# Patient Record
Sex: Female | Born: 1955
Health system: Southern US, Community
[De-identification: ages and names within clinical notes are randomized; demographics above are authoritative.]

## PROBLEM LIST (undated history)

## (undated) DIAGNOSIS — R0602 Shortness of breath: Secondary | ICD-10-CM

## (undated) DIAGNOSIS — M810 Age-related osteoporosis without current pathological fracture: Secondary | ICD-10-CM

## (undated) DIAGNOSIS — R600 Localized edema: Secondary | ICD-10-CM

## (undated) DIAGNOSIS — Z8489 Family history of other specified conditions: Secondary | ICD-10-CM

## (undated) DIAGNOSIS — K219 Gastro-esophageal reflux disease without esophagitis: Secondary | ICD-10-CM

## (undated) DIAGNOSIS — Z9889 Other specified postprocedural states: Secondary | ICD-10-CM

## (undated) DIAGNOSIS — J40 Bronchitis, not specified as acute or chronic: Secondary | ICD-10-CM

## (undated) DIAGNOSIS — T7840XA Allergy, unspecified, initial encounter: Secondary | ICD-10-CM

## (undated) DIAGNOSIS — M1990 Unspecified osteoarthritis, unspecified site: Secondary | ICD-10-CM

## (undated) DIAGNOSIS — E669 Obesity, unspecified: Secondary | ICD-10-CM

## (undated) DIAGNOSIS — J189 Pneumonia, unspecified organism: Secondary | ICD-10-CM

## (undated) DIAGNOSIS — J4520 Mild intermittent asthma, uncomplicated: Secondary | ICD-10-CM

## (undated) DIAGNOSIS — R112 Nausea with vomiting, unspecified: Secondary | ICD-10-CM

## (undated) DIAGNOSIS — E785 Hyperlipidemia, unspecified: Secondary | ICD-10-CM

## (undated) DIAGNOSIS — F32A Depression, unspecified: Secondary | ICD-10-CM

## (undated) DIAGNOSIS — F419 Anxiety disorder, unspecified: Secondary | ICD-10-CM

## (undated) DIAGNOSIS — E559 Vitamin D deficiency, unspecified: Secondary | ICD-10-CM

## (undated) HISTORY — DX: Gastro-esophageal reflux disease without esophagitis: K21.9

## (undated) HISTORY — DX: Depression, unspecified: F32.A

## (undated) HISTORY — PX: TONSILLECTOMY: SUR1361

## (undated) HISTORY — DX: Age-related osteoporosis without current pathological fracture: M81.0

## (undated) HISTORY — DX: Anxiety disorder, unspecified: F41.9

## (undated) HISTORY — DX: Obesity, unspecified: E66.9

## (undated) HISTORY — DX: Hyperlipidemia, unspecified: E78.5

## (undated) HISTORY — DX: Vitamin D deficiency, unspecified: E55.9

## (undated) HISTORY — PX: CERVICAL DISC SURGERY: SHX588

## (undated) HISTORY — PX: OTHER SURGICAL HISTORY: SHX169

## (undated) HISTORY — PX: TOTAL SHOULDER REPLACEMENT: SUR1217

## (undated) HISTORY — PX: ESOPHAGOGASTRODUODENOSCOPY: SHX1529

## (undated) HISTORY — DX: Unspecified osteoarthritis, unspecified site: M19.90

## (undated) HISTORY — PX: COLONOSCOPY: SHX174

## (undated) HISTORY — DX: Allergy, unspecified, initial encounter: T78.40XA

---
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
## 2002-04-15 ENCOUNTER — Encounter: Admission: RE | Admit: 2002-04-15 | Discharge: 2002-04-15 | Payer: Self-pay | Admitting: Family Medicine

## 2002-04-15 ENCOUNTER — Encounter: Payer: Self-pay | Admitting: Family Medicine

## 2002-05-21 ENCOUNTER — Encounter: Payer: Self-pay | Admitting: Internal Medicine

## 2002-10-29 ENCOUNTER — Other Ambulatory Visit: Admission: RE | Admit: 2002-10-29 | Discharge: 2002-10-29 | Payer: Self-pay | Admitting: Obstetrics & Gynecology

## 2003-11-07 ENCOUNTER — Other Ambulatory Visit: Admission: RE | Admit: 2003-11-07 | Discharge: 2003-11-07 | Payer: Self-pay | Admitting: Obstetrics and Gynecology

## 2007-04-06 ENCOUNTER — Emergency Department (HOSPITAL_COMMUNITY): Admission: EM | Admit: 2007-04-06 | Discharge: 2007-04-07 | Payer: Self-pay | Admitting: Emergency Medicine

## 2009-11-07 ENCOUNTER — Encounter: Admission: RE | Admit: 2009-11-07 | Discharge: 2009-11-07 | Payer: Self-pay | Admitting: Internal Medicine

## 2012-04-13 ENCOUNTER — Encounter: Payer: Self-pay | Admitting: Internal Medicine

## 2012-10-04 ENCOUNTER — Ambulatory Visit: Payer: 59

## 2012-10-04 ENCOUNTER — Ambulatory Visit (INDEPENDENT_AMBULATORY_CARE_PROVIDER_SITE_OTHER): Payer: 59 | Admitting: Family Medicine

## 2012-10-04 VITALS — BP 142/88 | HR 78 | Temp 98.2°F | Resp 16 | Ht 66.0 in | Wt 207.0 lb

## 2012-10-04 DIAGNOSIS — M25522 Pain in left elbow: Secondary | ICD-10-CM

## 2012-10-04 DIAGNOSIS — M7700 Medial epicondylitis, unspecified elbow: Secondary | ICD-10-CM

## 2012-10-04 DIAGNOSIS — M25529 Pain in unspecified elbow: Secondary | ICD-10-CM

## 2012-10-04 DIAGNOSIS — M7702 Medial epicondylitis, left elbow: Secondary | ICD-10-CM

## 2012-10-04 NOTE — Patient Instructions (Addendum)
1. Elbow pain, left  DG Elbow Complete Left  2. Medial epicondylitis of left elbow     RECOMMEND REST, ICING ELBOW TWICE DAILY (15-20 MINUTES EACH SESSION), HOME EXERCISES, ADVIL (3 TABLETS) THREE TIMES DAILY WITH FOOD.     Little Leaguer's Elbow (Medial Epicondylar Apophysitis) with Rehab Medial epicondylar apophysitis is also called Little Leaguer's elbow. The condition involves inflammation of the growth plate of the inner elbow (medial epicondyle) that causes elbow pain. The medial epicondyle is the site of attachment for the forearm muscles that are responsible for bending the wrist. Growth plates are a weaker area of bone, and are highly vulnerable to injury in skeletally immature individuals. Little Leaguer's elbow is an overuse injury that often lasts temporarily. It is uncommon after age 42. SYMPTOMS   Slightly swollen, warm, and tender bump of the inner elbow.  Elbow pain with use of the elbow and wrist.  Pain that gets worse when bending the wrist against force (curls, lifting, throwing), or following an extended period of vigorous exercise in an adolescent.  In more severe cases, pain during less vigorous activity.  Inability to throw at full speed.  Inability to fully straighten the elbow. CAUSES  Little Leaguer's elbow is caused by repeated stress to the medial epicondylar growth plate. This stress causes inflammation, which results in pain. RISK INCREASES WITH:  Throwing sports (baseball, softball).  Conditioning routines that are too intense, such as weight lifting.  Being overweight.  Boys between 73 and 17 years of age.  Rapid skeletal growth.  Poor strength and flexibility. PREVENTION  Warm up and stretch properly before activity.  Allow for adequate recovery between workouts.  Maintain physical fitness:  Strength, flexibility, and endurance.  Cardiovascular fitness.  Avoid extremes (too light or too heavy) in training.  Learn and use proper  exercise technique. PROGNOSIS  The course of the condition depends on the severity of the injury. Mild cases usually resolve with slight reduction of activity level. However, moderate to severe cases often require significantly reduced activity for 3 to 4 months.  RELATED COMPLICATIONS   Bone infection.  Growth plate pulling off the arm bone, resulting in a fracture.  Ongoing inability to fully straighten the elbow. TREATMENT  Treatment first involves the use of ice and medicine, to reduce pain and inflammation. The use of strengthening and stretching exercises may help reduce pain with activity. These exercises may be performed at home or with a therapist. Certain activities (throwing or lifting greater than 10 pounds) should be avoided, until symptoms go away. Individuals who suffer from chronic Little Leaguer's elbow are often referred to a therapist for further evaluation and treatment. Your caregiver may advise wearing a sling or brace to restrain the elbow. Surgery is rarely needed to treat this condition.  MEDICATION   If pain medicine is needed, nonsteroidal anti-inflammatory medicines (aspirin and ibuprofen), or other minor pain relievers (acetaminophen), are often advised.  Do not take pain medicine for 7 days before surgery.  Prescription pain relievers may be given, if your caregiver thinks they are needed. Use only as directed and only as much as you need.  Corticosteroid injections may be given by your caregiver. These injections should be reserved for the most serious cases, because they may only be given a certain number of times. HEAT AND COLD  Cold treatment (icing) should be applied for 10 to 15 minutes every 2 to 3 hours for inflammation and pain, and immediately after activity that aggravates your symptoms. Use  ice packs or an ice massage.  Heat treatment may be used before performing stretching and strengthening activities prescribed by your caregiver, physical  therapist, or athletic trainer. Use a heat pack or a warm water soak. SEEK MEDICAL CARE IF:   Symptoms get worse or do not improve in 4 weeks, despite treatment.  You develop a fever greater than 101F. EXERCISES  RANGE OF MOTION (ROM) AND STRETCHING EXERCISES - Little Leaguer's Elbow These exercises may help you when beginning to rehabilitate your injury. Your symptoms may resolve with or without further involvement from your physician, physical therapist or athletic trainer. While completing these exercises, remember:   Restoring tissue flexibility helps normal motion to return to the joints. This allows healthier, less painful movement and activity.  An effective stretch should be held for at least 30 seconds.  A stretch should never be painful. You should only feel a gentle lengthening or release in the stretched tissue. RANGE OF MOTION  Wrist Extension, Active-Assisted   Extend your right / left elbow and turn your palm upwards.*  Gently pull your palm and fingertips back, so your wrist extends and your fingers point more toward the ground.  You should feel a gentle stretch on the inside of your forearm.  Hold this position for __________ seconds. Repeat __________ times. Complete this exercise __________ times per day. *If directed by your physician, physical therapist or athletic trainer, complete this stretch with your elbow bent, rather than extended. STRETCH  Wrist Extension   Place your right / left fingertips on a tabletop, leaving your elbow slightly bent. Your fingers should point backwards.  Gently press your fingers and palm down onto the table by straightening your elbow. You should feel a stretch on the inside of your forearm.  Hold this position for __________ seconds. Repeat __________ times. Complete this stretch __________ times per day.  STRENGTHENING EXERCISES - Little Leaguer's Elbow These exercises may help you when beginning to rehabilitate your injury.  They may resolve your symptoms with or without further involvement from your physician, physical therapist or athletic trainer. While completing these exercises, remember:   Muscles can gain both the endurance and the strength needed for everyday activities through controlled exercises.  Complete these exercises as instructed by your physician, physical therapist or athletic trainer. Increase the resistance and repetitions only as guided. STRENGTH Wrist Flexors  Sit with your right / left forearm palm-up and fully supported. Your elbow should be resting below the height of your shoulder. Allow your wrist to extend over the edge of the surface.  Loosely holding a __________ weight, or a piece of rubber exercise band or tubing, slowly curl your hand up toward your forearm.  Hold this position for __________ seconds. Slowly lower the wrist back to the starting position in a controlled manner. Repeat __________ times. Complete this exercise __________ times per day.  STRENGTH - Ulnar Deviators  Stand with a ____________________ weight in your right / left hand, or sit while holding a rubber exercise band or tubing, with your healthy arm supported.  Move your wrist so that your pinkie travels toward your forearm and your thumb moves away from your forearm.  Hold this position for __________ seconds and then slowly lower the wrist back to the starting position. Repeat __________ times. Complete this exercise __________ times per day STRENGTH  Forearm Pronators   Sit with your right / left forearm supported on a table, keeping your elbow below shoulder height. Rest your hand over the  edge, palm up.  Gently grip a hammer or a soup ladle.  Without moving your elbow, slowly turn your palm and hand upward to a "thumbs-up" position.  Hold this position for __________ seconds. Slowly return to the starting position. Repeat __________ times. Complete this exercise __________ times per day.  STRENGTH  - Grip   Grasp a tennis ball, a dense sponge, or a large, rolled sock in your hand.  Squeeze as hard as you can, without increasing any pain.  Hold this position for __________ seconds. Release your grip slowly. Repeat __________ times. Complete this exercise __________ times per day.  Document Released: 09/30/2005 Document Revised: 12/23/2011 Document Reviewed: 01/12/2009 [facility] Patient Information 2013 [place], [place].

## 2012-10-04 NOTE — Progress Notes (Signed)
[address]   [place], [place]  [address]   [phone]  Subjective:    Patient ID: Sarah Spencer, female    DOB: 1956-09-23, 56 y.o.   MRN: [id number]  HPIThis 56 y.o. female presents for evaluation of L arm pain.  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  No n/t.  +weakness in arm today. Pain starts on medial aspect of L elbow.  R hand dominant.  Entire arm feels swollen.  Slept well. No pain at rest; pain worsens with movement.  No injury at all.  No associated neck pain, shoulder pain, wrist pain.   Worried about a blood clot; no history of clot; no family history of clotting disorder; no recent sendentary lifestyle; no recent surgery; no HRT.  PCP:  Tomi Bamberger.   Review of Systems  Constitutional: Negative for fever, chills, diaphoresis and fatigue.  Respiratory: Negative for shortness of breath.   Cardiovascular: Negative for chest pain, palpitations and leg swelling.  Musculoskeletal: Positive for joint swelling and arthralgias. Negative for back pain.  Skin: Negative for rash and wound.  Neurological: Positive for weakness. Negative for numbness.        Past Medical History  Diagnosis Date  . Anxiety   . Arthritis     Past Surgical History  Procedure Date  . Cesarean section   . Tonsillectomy   . Laparascopy     Prior to Admission medications   Not on File    No Known Allergies  History   Social History  . Marital Status: Married    Spouse Name: N/A    Number of Children: N/A  . Years of Education: N/A   Occupational History  . Not on file.   Social History Main Topics  . Smoking status: Never Smoker   . Smokeless tobacco: Not on file  . Alcohol Use: Not on file  . Drug Use: Not on file  . Sexually Active: Not on file   Other Topics Concern  . Not on file   Social History Narrative  . No narrative on file    History reviewed. No pertinent family history.  Objective:   Physical Exam  Nursing note and vitals reviewed. Constitutional: She appears well-developed and well-nourished. No distress.  Cardiovascular:  Pulses:      Radial pulses are 2+ on the right side, and 2+ on the left side.       Capillary refill < 3 seconds LUE.  Musculoskeletal:       Right shoulder: Normal.       Left shoulder: She exhibits normal range of motion, no tenderness, no pain, no spasm, normal pulse and normal strength.       Left elbow: She exhibits normal range of motion, no swelling, no effusion, no deformity and no laceration. tenderness found. Medial epicondyle tenderness noted. No radial head, no lateral epicondyle and no olecranon process tenderness noted.       Left wrist: She exhibits normal range of motion, no tenderness and no swelling.       Cervical back: She exhibits normal range of motion, no pain and no spasm.  Neurological: She has normal strength. No sensory deficit.  Skin: Skin is warm and dry. No rash noted. She is not diaphoretic. No erythema.  Psychiatric: She has a normal mood and affect. Her behavior is normal.      [facility] reading (PRIMARY) by  Dr. Katrinka Blazing.  L ELBOW:  NAD.  Assessment & Plan:   1. Elbow pain, left  DG Elbow Complete Left  2. Medial epicondylitis of left elbow        1. L elbow pain:  New. Secondary to medial epicondylitis.  Advil 600mg  tid with food for next week and then PRN.  No evidence of DVT; symptoms suggestive of musculoskeletal etiology. 2.  L Medial Epicondylitis:  New.  Secondary to overuse. Rest, ice, elevate, NSAIDS.  Provided with wrist splint to immobilize/limit ROM of L forearm.  Light duty at work for next week; no use of L arm.  Home exercises provided to perform daily.  If no improvement in 2-3 weeks, recommend contacting Dr. Charlett Blake for evaluation.

## 2012-10-09 NOTE — Progress Notes (Signed)
Reviewed and agree.

## 2013-01-20 ENCOUNTER — Encounter: Payer: Self-pay | Admitting: Internal Medicine

## 2013-08-24 ENCOUNTER — Emergency Department (INDEPENDENT_AMBULATORY_CARE_PROVIDER_SITE_OTHER): Payer: 59

## 2013-08-24 ENCOUNTER — Encounter (HOSPITAL_COMMUNITY): Payer: Self-pay | Admitting: Emergency Medicine

## 2013-08-24 ENCOUNTER — Emergency Department (HOSPITAL_COMMUNITY)
Admission: EM | Admit: 2013-08-24 | Discharge: 2013-08-24 | Disposition: A | Discharge status: Home / Self Care | Payer: 59 | Source: Home / Self Care | Attending: Family Medicine | Admitting: Family Medicine

## 2013-08-24 DIAGNOSIS — M25552 Pain in left hip: Secondary | ICD-10-CM

## 2013-08-24 DIAGNOSIS — M25559 Pain in unspecified hip: Secondary | ICD-10-CM

## 2013-08-24 MED ORDER — DICLOFENAC POTASSIUM 50 MG PO TABS: 50.0000 mg | ORAL_TABLET | Freq: Three times a day (TID) | ORAL | Status: DC

## 2013-08-24 NOTE — ED Provider Notes (Signed)
CSN: [id number]     Arrival date & time 08/24/13  1123 History   None    Chief Complaint  Patient presents with  . Leg Pain    pain in left leg x 2 wks.    (Consider location/radiation/quality/duration/timing/severity/associated sxs/prior Treatment) Patient is a 57 y.o. female presenting with leg pain. The history is provided by the patient.  Leg Pain Location:  Hip Time since incident:  1 day Injury: no   Hip location:  L hip Pain details:    Quality:  Aching and sharp   Radiates to:  Does not radiate   Severity:  Moderate   Onset quality:  Sudden   Progression:  Worsening (3rd episode in past sev weeks.) Chronicity:  Recurrent Dislocation: no   Ineffective treatments:  NSAIDs Associated symptoms: decreased ROM and muscle weakness   Associated symptoms: no back pain, no itching and no numbness   Risk factors: known bone disorder and obesity     Past Medical History  Diagnosis Date  . Anxiety   . Arthritis    Past Surgical History  Procedure Laterality Date  . Cesarean section    . Tonsillectomy    . Laparascopy     History reviewed. No pertinent family history. History  Substance Use Topics  . Smoking status: Never Smoker   . Smokeless tobacco: Not on file  . Alcohol Use: No   OB History   Grav Para Term Preterm Abortions TAB SAB Ect Mult Living                 Review of Systems  Musculoskeletal: Positive for arthralgias, gait problem and myalgias. Negative for back pain and joint swelling.  Skin: Negative.  Negative for itching.    Allergies  Review of patient's allergies indicates no known allergies.  Home Medications   Current Outpatient Rx  Name  Route  Sig  Dispense  Refill  . clonazePAM (KLONOPIN) 0.5 MG tablet   Oral   Take 0.5 mg by mouth 2 (two) times daily as needed for anxiety.         Marland Kitchen DICLOFENAC PO   Oral   Take 25 mg by mouth.         . MULTIPLE VITAMIN PO   Oral   Take by mouth.         . diclofenac (CATAFLAM) 50 MG  tablet   Oral   Take 1 tablet (50 mg total) by mouth 3 (three) times daily.   30 tablet   0    BP 167/89  Pulse 81  Temp(Src) 98.4 F (36.9 C)  Resp 12  SpO2 100% Physical Exam  Nursing note and vitals reviewed. Constitutional: She is oriented to person, place, and time. She appears well-developed and well-nourished.  Musculoskeletal: She exhibits tenderness.       Left hip: She exhibits decreased range of motion and tenderness. She exhibits no crepitus and no deformity.       Lumbar back: Normal.       Back:  Neurological: She is alert and oriented to person, place, and time.  Skin: Skin is warm and dry.    ED Course  Procedures (including critical care time) Labs Review Labs Reviewed - No data to display Imaging Review Dg Hip Complete Left  08/24/2013   CLINICAL DATA:  Pain.  EXAM: LEFT HIP - COMPLETE 2+ VIEW  COMPARISON:  None.  FINDINGS: Degenerative changes no the lumbar spine and both hips. Degenerative changes both SI joints.  No fracture or dislocation. Calcified pelvic phleboliths.  IMPRESSION: Degenerative changes lumbar spine, both SI joints, and both hips. No acute abnormality.   Electronically Signed   By: Maisie Fus  Register   On: 08/24/2013 13:04    EKG Interpretation     Ventricular Rate:    PR Interval:    QRS Duration:   QT Interval:    QTC Calculation:   R Axis:     Text Interpretation:              MDM  X-rays reviewed and report per radiologist.     Linna Hoff, MD 08/24/13 1324

## 2013-08-24 NOTE — ED Notes (Signed)
Pain in left leg x 2 wks.  State pulled something in leg couple weeks ago and resolved on its on own.  Pt states that last night she pulled something again in the left leg and is having sharp pain with movement and throbbing sensation that radiates in the left hip. Pt has tried otc pain meds and muscle relaxer with no relief in symptoms.

## 2014-07-14 ENCOUNTER — Other Ambulatory Visit: Payer: Self-pay | Admitting: Nurse Practitioner

## 2014-07-14 DIAGNOSIS — R0781 Pleurodynia: Secondary | ICD-10-CM

## 2014-07-14 DIAGNOSIS — R221 Localized swelling, mass and lump, neck: Secondary | ICD-10-CM

## 2014-07-18 ENCOUNTER — Other Ambulatory Visit: Payer: 59

## 2014-07-19 ENCOUNTER — Ambulatory Visit
Admission: RE | Admit: 2014-07-19 | Discharge: 2014-07-19 | Disposition: A | Discharge status: Home / Self Care | Payer: 59 | Source: Ambulatory Visit | Attending: Nurse Practitioner | Admitting: Nurse Practitioner

## 2014-07-19 DIAGNOSIS — R0781 Pleurodynia: Secondary | ICD-10-CM

## 2014-07-19 DIAGNOSIS — R221 Localized swelling, mass and lump, neck: Secondary | ICD-10-CM

## 2015-02-27 ENCOUNTER — Encounter (HOSPITAL_COMMUNITY): Payer: Self-pay | Admitting: Emergency Medicine

## 2015-02-27 ENCOUNTER — Emergency Department (HOSPITAL_COMMUNITY)
Admission: EM | Admit: 2015-02-27 | Discharge: 2015-02-27 | Disposition: A | Discharge status: Home / Self Care | Payer: 59 | Source: Home / Self Care | Attending: Family Medicine | Admitting: Family Medicine

## 2015-02-27 DIAGNOSIS — N390 Urinary tract infection, site not specified: Secondary | ICD-10-CM

## 2015-02-27 DIAGNOSIS — K5901 Slow transit constipation: Secondary | ICD-10-CM

## 2015-02-27 DIAGNOSIS — R140 Abdominal distension (gaseous): Secondary | ICD-10-CM

## 2015-02-27 DIAGNOSIS — M7918 Myalgia, other site: Secondary | ICD-10-CM

## 2015-02-27 LAB — POCT URINALYSIS DIP (DEVICE)
Bilirubin Urine: NEGATIVE
GLUCOSE, UA: NEGATIVE mg/dL
Ketones, ur: NEGATIVE mg/dL
Nitrite: POSITIVE — AB
Protein, ur: >=300 mg/dL — AB
SPECIFIC GRAVITY, URINE: 1.025 (ref 1.005–1.030)
UROBILINOGEN UA: 0.2 mg/dL (ref 0.0–1.0)
pH: 7 (ref 5.0–8.0)

## 2015-02-27 MED ORDER — CIPROFLOXACIN HCL 500 MG PO TABS: 500.0000 mg | ORAL_TABLET | Freq: Two times a day (BID) | ORAL | Status: DC

## 2015-02-27 NOTE — Discharge Instructions (Signed)
Bloating Bloating is the feeling of fullness in your belly. You may feel as though your pants are too tight. Often the cause of bloating is overeating, retaining fluids, or having gas in your bowel. It is also caused by swallowing air and eating foods that cause gas. Irritable bowel syndrome is one of the most common causes of bloating. Constipation is also a common cause. Sometimes more serious problems can cause bloating. SYMPTOMS  Usually there is a feeling of fullness, as though your abdomen is bulged out. There may be mild discomfort.  DIAGNOSIS  Usually no particular testing is necessary for most bloating. If the condition persists and seems to become worse, your caregiver may do additional testing.  TREATMENT   There is no direct treatment for bloating.  Do not put gas into the bowel. Avoid chewing gum and sucking on candy. These tend to make you swallow air. Swallowing air can also be a nervous habit. Try to avoid this.  Avoiding high residue diets will help. Eat foods with soluble fibers (examples include root vegetables, apples, or barley) and substitute dairy products with soy and rice products. This helps irritable bowel syndrome.  If constipation is the cause, then a high residue diet with more fiber will help.  Avoid carbonated beverages.  Over-the-counter preparations are available that help reduce gas. Your pharmacist can help you with this. SEEK MEDICAL CARE IF:   Bloating continues and seems to be getting worse.  You notice a weight gain.  You have a weight loss but the bloating is getting worse.  You have changes in your bowel habits or develop nausea or vomiting. SEEK IMMEDIATE MEDICAL CARE IF:   You develop shortness of breath or swelling in your legs.  You have an increase in abdominal pain or develop chest pain. Document Released: 07/31/2006 Document Revised: 12/23/2011 Document Reviewed: 09/18/2007 [facility] Patient Information 2015 [place], [place]. This  information is not intended to replace advice given to you by your health care provider. Make sure you discuss any questions you have with your health care provider.  Constipation Constipation is when a person has fewer than three bowel movements a week, has difficulty having a bowel movement, or has stools that are dry, hard, or larger than normal. As people grow older, constipation is more common. If you try to fix constipation with medicines that make you have a bowel movement (laxatives), the problem may get worse. Long-term laxative use may cause the muscles of the colon to become weak. A low-fiber diet, not taking in enough fluids, and taking certain medicines may make constipation worse.  CAUSES   Certain medicines, such as antidepressants, pain medicine, iron supplements, antacids, and water pills.   Certain diseases, such as diabetes, irritable bowel syndrome (IBS), thyroid disease, or depression.   Not drinking enough water.   Not eating enough fiber-rich foods.   Stress or travel.   Lack of physical activity or exercise.   Ignoring the urge to have a bowel movement.   Using laxatives too much.  SIGNS AND SYMPTOMS   Having fewer than three bowel movements a week.   Straining to have a bowel movement.   Having stools that are hard, dry, or larger than normal.   Feeling full or bloated.   Pain in the lower abdomen.   Not feeling relief after having a bowel movement.  DIAGNOSIS  Your health care provider will take a medical history and perform a physical exam. Further testing may be done for severe constipation.  Some tests may include:  A barium enema X-ray to examine your rectum, colon, and, sometimes, your small intestine.   A sigmoidoscopy to examine your lower colon.   A colonoscopy to examine your entire colon. TREATMENT  Treatment will depend on the severity of your constipation and what is causing it. Some dietary treatments include drinking  more fluids and eating more fiber-rich foods. Lifestyle treatments may include regular exercise. If these diet and lifestyle recommendations do not help, your health care provider may recommend taking over-the-counter laxative medicines to help you have bowel movements. Prescription medicines may be prescribed if over-the-counter medicines do not work.  HOME CARE INSTRUCTIONS   Eat foods that have a lot of fiber, such as fruits, vegetables, whole grains, and beans.  Limit foods high in fat and processed sugars, such as french fries, hamburgers, cookies, candies, and soda.   A fiber supplement may be added to your diet if you cannot get enough fiber from foods.   Drink enough fluids to keep your urine clear or pale yellow.   Exercise regularly or as directed by your health care provider.   Go to the restroom when you have the urge to go. Do not hold it.   Only take over-the-counter or prescription medicines as directed by your health care provider. Do not take other medicines for constipation without talking to your health care provider first.  SEEK IMMEDIATE MEDICAL CARE IF:   You have bright red blood in your stool.   Your constipation lasts for more than 4 days or gets worse.   You have abdominal or rectal pain.   You have thin, pencil-like stools.   You have unexplained weight loss. MAKE SURE YOU:   Understand these instructions.  Will watch your condition.  Will get help right away if you are not doing well or get worse. Document Released: 06/28/2004 Document Revised: 10/05/2013 Document Reviewed: 07/12/2013 [facility] Patient Information 2015 [place], [place]. This information is not intended to replace advice given to you by your health care provider. Make sure you discuss any questions you have with your health care provider.  Urinary Tract Infection Urinary tract infections (UTIs) can develop anywhere along your urinary tract. Your urinary tract is your body's  drainage system for removing wastes and extra water. Your urinary tract includes two kidneys, two ureters, a bladder, and a urethra. Your kidneys are a pair of bean-shaped organs. Each kidney is about the size of your fist. They are located below your ribs, one on each side of your spine. CAUSES Infections are caused by microbes, which are microscopic organisms, including fungi, viruses, and bacteria. These organisms are so small that they can only be seen through a microscope. Bacteria are the microbes that most commonly cause UTIs. SYMPTOMS  Symptoms of UTIs may vary by age and gender of the patient and by the location of the infection. Symptoms in young women typically include a frequent and intense urge to urinate and a painful, burning feeling in the bladder or urethra during urination. Older women and men are more likely to be tired, shaky, and weak and have muscle aches and abdominal pain. A fever may mean the infection is in your kidneys. Other symptoms of a kidney infection include pain in your back or sides below the ribs, nausea, and vomiting. DIAGNOSIS To diagnose a UTI, your caregiver will ask you about your symptoms. Your caregiver also will ask to provide a urine sample. The urine sample will be tested for bacteria and  white blood cells. White blood cells are made by your body to help fight infection. TREATMENT  Typically, UTIs can be treated with medication. Because most UTIs are caused by a bacterial infection, they usually can be treated with the use of antibiotics. The choice of antibiotic and length of treatment depend on your symptoms and the type of bacteria causing your infection. HOME CARE INSTRUCTIONS  If you were prescribed antibiotics, take them exactly as your caregiver instructs you. Finish the medication even if you feel better after you have only taken some of the medication.  Drink enough water and fluids to keep your urine clear or pale yellow.  Avoid caffeine, tea,  and carbonated beverages. They tend to irritate your bladder.  Empty your bladder often. Avoid holding urine for long periods of time.  Empty your bladder before and after sexual intercourse.  After a bowel movement, women should cleanse from front to back. Use each tissue only once. SEEK MEDICAL CARE IF:   You have back pain.  You develop a fever.  Your symptoms do not begin to resolve within 3 days. SEEK IMMEDIATE MEDICAL CARE IF:   You have severe back pain or lower abdominal pain.  You develop chills.  You have nausea or vomiting.  You have continued burning or discomfort with urination. MAKE SURE YOU:   Understand these instructions.  Will watch your condition.  Will get help right away if you are not doing well or get worse. Document Released: 07/10/2005 Document Revised: 03/31/2012 Document Reviewed: 11/08/2011 [facility] Patient Information 2015 [place]. This information is not intended to replace advice given to you by your health care provider. Make sure you discuss any questions you have with your health care provider.

## 2015-02-27 NOTE — ED Notes (Signed)
C/o uti States she has a lower abd pain, bloating, frequent urination, and burning when urinating advil and azo was used as tx

## 2015-02-27 NOTE — ED Provider Notes (Signed)
CSN: [id number]     Arrival date & time 02/27/15  1626 History   First MD Initiated Contact with Patient 02/27/15 1746     Chief Complaint  Patient presents with  . Urinary Tract Infection   (Consider location/radiation/quality/duration/timing/severity/associated sxs/prior Treatment) HPI Comments: 59 year old female complaining of a three-day history of urinary urgency, frequency and dysuria. She has described her urine-pinkish to yellow. She believes she has had a low-grade fever. She has been taking Azo, Advil, increasing water intake and drinking cranberry juice. Her second complaint is abdominal swelling and bloating. She somewhat equivocal in describing her bowel movements but suggests that she may not be completely emptying her bowels. Denies abdominal pain.   Past Medical History  Diagnosis Date  . Anxiety   . Arthritis    Past Surgical History  Procedure Laterality Date  . Cesarean section    . Tonsillectomy    . Laparascopy     History reviewed. No pertinent family history. History  Substance Use Topics  . Smoking status: Never Smoker   . Smokeless tobacco: Not on file  . Alcohol Use: No   OB History    No data available     Review of Systems  Constitutional: Positive for fever. Negative for activity change and fatigue.  HENT: Negative.   Respiratory: Negative.  Negative for cough and shortness of breath.   Cardiovascular: Negative for chest pain and palpitations.  Gastrointestinal: Positive for abdominal distention. Negative for nausea, vomiting, abdominal pain, diarrhea and blood in stool.  Genitourinary: Positive for dysuria, urgency, frequency and hematuria. Negative for vaginal bleeding, vaginal discharge and pelvic pain.  Musculoskeletal: Positive for back pain. Negative for neck pain.  Skin: Negative.   Neurological: Negative.   Psychiatric/Behavioral: Negative.     Allergies  Cefdinir  Home Medications   Prior to Admission medications   Medication  Sig Start Date End Date Taking? Authorizing Provider  ciprofloxacin (CIPRO) 500 MG tablet Take 1 tablet (500 mg total) by mouth 2 (two) times daily. 02/27/15   Hayden Rasmussenavid Jeron Grahn, NP  clonazePAM (KLONOPIN) 0.5 MG tablet Take 0.5 mg by mouth 2 (two) times daily as needed for anxiety.    Historical Provider, MD  diclofenac (CATAFLAM) 50 MG tablet Take 1 tablet (50 mg total) by mouth 3 (three) times daily. 08/24/13   Linna HoffJames D Kindl, MD  DICLOFENAC PO Take 25 mg by mouth.    Historical Provider, MD  MULTIPLE VITAMIN PO Take by mouth.    Historical Provider, MD   BP 143/91 mmHg  Pulse 117  Temp(Src) 100.4 F (38 C) (Oral)  Resp 16  SpO2 96% Physical Exam  Constitutional: She is oriented to person, place, and time. She appears well-developed and well-nourished. No distress.  Eyes: Conjunctivae and EOM are normal.  Neck: Normal range of motion. Neck supple.  Cardiovascular: Normal rate, regular rhythm and normal heart sounds.   Pulmonary/Chest: Effort normal and breath sounds normal. No respiratory distress. She has no wheezes. She has no rales.  Abdominal: Soft. Bowel sounds are normal. She exhibits no distension and no mass. There is no rebound and no guarding.  Percussion of the upper abdomen is tympanic. Percussion of the lower half of the abdomen is primarily dull to flat. Mild tenderness in the right lower quadrant. No tenderness over the suprapubic areas.  Musculoskeletal: She exhibits no edema.  No CVAT. Marked tenderness to the lower right para-lumbar musculature.  Neurological: She is alert and oriented to person, place, and time. She exhibits normal  muscle tone.  Skin: Skin is warm and dry.  Nursing note and vitals reviewed.   ED Course  Procedures (including critical care time) Labs Review Labs Reviewed  POCT URINALYSIS DIP (DEVICE) - Abnormal; Notable for the following:    Hgb urine dipstick MODERATE (*)    Protein, ur >=300 (*)    Nitrite POSITIVE (*)    Leukocytes, UA LARGE (*)     All other components within normal limits  URINE CULTURE    Imaging Review No results found.   MDM   1. UTI (lower urinary tract infection)   2. Abdominal bloating   3. Slow transit constipation    Pt allergic to cephalosporin. Tx with Cipro 500 bid Urine culture pending Plenty of fluids AZO For worsening may return, ie fever, vomiting, feeling worse, or go to the ED     Hayden Rasmussenavid Jubal Rademaker, NP 02/27/15 1843  Hayden Rasmussenavid Aubert Choyce, NP 02/27/15 1849

## 2015-03-01 LAB — URINE CULTURE
Colony Count: >=100000
Special Requests: NORMAL

## 2015-03-01 NOTE — ED Notes (Signed)
Urine culture: >100,000 colonies E. Coli.  Pt. adequately treated with Cipro. Vassie MoselleYork, Mashell Sieben M 03/01/2015

## 2016-01-03 ENCOUNTER — Other Ambulatory Visit: Payer: Self-pay

## 2016-01-03 DIAGNOSIS — N644 Mastodynia: Secondary | ICD-10-CM

## 2016-01-04 ENCOUNTER — Other Ambulatory Visit: Payer: Self-pay

## 2016-01-04 DIAGNOSIS — Z1231 Encounter for screening mammogram for malignant neoplasm of breast: Secondary | ICD-10-CM

## 2016-01-19 ENCOUNTER — Ambulatory Visit
Admission: RE | Admit: 2016-01-19 | Discharge: 2016-01-19 | Disposition: A | Discharge status: Home / Self Care | Payer: 59 | Source: Ambulatory Visit

## 2016-01-19 DIAGNOSIS — Z1231 Encounter for screening mammogram for malignant neoplasm of breast: Secondary | ICD-10-CM

## 2016-01-23 ENCOUNTER — Other Ambulatory Visit: Payer: Self-pay | Admitting: Nurse Practitioner

## 2016-01-23 DIAGNOSIS — R928 Other abnormal and inconclusive findings on diagnostic imaging of breast: Secondary | ICD-10-CM

## 2016-01-26 ENCOUNTER — Ambulatory Visit
Admission: RE | Admit: 2016-01-26 | Discharge: 2016-01-26 | Disposition: A | Discharge status: Home / Self Care | Payer: 59 | Source: Ambulatory Visit | Attending: Nurse Practitioner | Admitting: Nurse Practitioner

## 2016-01-26 DIAGNOSIS — R928 Other abnormal and inconclusive findings on diagnostic imaging of breast: Secondary | ICD-10-CM

## 2016-07-04 ENCOUNTER — Other Ambulatory Visit: Payer: Self-pay | Admitting: Nurse Practitioner

## 2016-07-04 DIAGNOSIS — N6310 Unspecified lump in the right breast, unspecified quadrant: Secondary | ICD-10-CM

## 2016-07-22 ENCOUNTER — Ambulatory Visit
Admission: RE | Admit: 2016-07-22 | Discharge: 2016-07-22 | Disposition: A | Discharge status: Home / Self Care | Payer: 59 | Source: Ambulatory Visit | Attending: Nurse Practitioner | Admitting: Nurse Practitioner

## 2016-07-22 DIAGNOSIS — N6310 Unspecified lump in the right breast, unspecified quadrant: Secondary | ICD-10-CM

## 2016-08-23 ENCOUNTER — Encounter (HOSPITAL_COMMUNITY): Payer: Self-pay | Admitting: Emergency Medicine

## 2016-08-23 ENCOUNTER — Ambulatory Visit (HOSPITAL_COMMUNITY)
Admission: EM | Admit: 2016-08-23 | Discharge: 2016-08-23 | Disposition: A | Discharge status: Home / Self Care | Payer: 59 | Attending: Emergency Medicine | Admitting: Emergency Medicine

## 2016-08-23 DIAGNOSIS — J040 Acute laryngitis: Secondary | ICD-10-CM

## 2016-08-23 MED ORDER — DEXAMETHASONE SODIUM PHOSPHATE 10 MG/ML IJ SOLN
INTRAMUSCULAR | Status: AC
  Filled 2016-08-23: qty 1

## 2016-08-23 MED ORDER — AMOXICILLIN 500 MG PO CAPS: 500.0000 mg | ORAL_CAPSULE | Freq: Three times a day (TID) | ORAL | 0 refills | Status: DC

## 2016-08-23 MED ORDER — DEXAMETHASONE SODIUM PHOSPHATE 10 MG/ML IJ SOLN
10.0000 mg | Freq: Once | INTRAMUSCULAR | Status: AC
  Administered 2016-08-23: 10 mg via INTRAMUSCULAR

## 2016-08-23 NOTE — ED Provider Notes (Signed)
[facility]    CSN: [id number] Arrival date & time: 08/23/16  1125     History   Chief Complaint Chief Complaint  Patient presents with  . Sore Throat    HPI Sarah Spencer is a 60 y.o. female.   HPI  She is a 60 year old woman here for evaluation of sore throat. She has had symptoms for about 2 weeks. Initially, she also had nasal symptoms and cough, but these have improved. Currently, she has pain in her throat and lower down in her neck. She also has lost her voice. Throat pain is worse with swallowing. She is able to eat and drink. She denies any fevers. There was a member of her church that was recently diagnosed with influenza B.  She states she gets similar symptoms about twice a year and is usually treated with antibiotics.  Past Medical History:  Diagnosis Date  . Anxiety   . Arthritis     There are no active problems to display for this patient.   Past Surgical History:  Procedure Laterality Date  . CESAREAN SECTION    . laparascopy    . TONSILLECTOMY      OB History    No data available       Home Medications    Prior to Admission medications   Medication Sig Start Date End Date Taking? Authorizing Provider  MULTIPLE VITAMIN PO Take by mouth.   Yes Historical Provider, MD  amoxicillin (AMOXIL) 500 MG capsule Take 1 capsule (500 mg total) by mouth 3 (three) times daily. 08/23/16   Charm RingsErin J Delmore Sear, MD  clonazePAM (KLONOPIN) 0.5 MG tablet Take 0.5 mg by mouth 2 (two) times daily as needed for anxiety.    Historical Provider, MD  diclofenac (CATAFLAM) 50 MG tablet Take 1 tablet (50 mg total) by mouth 3 (three) times daily. 08/24/13   Linna HoffJames D Kindl, MD    Family History History reviewed. No pertinent family history.  Social History Social History  Substance Use Topics  . Smoking status: Never Smoker  . Smokeless tobacco: Never Used  . Alcohol use No     Allergies   Cefdinir   Review of Systems Review of Systems As in history of  present illness  Physical Exam Triage Vital Signs ED Triage Vitals [08/23/16 1131]  Enc Vitals Group     BP 156/85     Pulse Rate 98     Resp 18     Temp 98.5 F (36.9 C)     Temp Source Oral     SpO2 98 %     Weight      Height      Head Circumference      Peak Flow      Pain Score      Pain Loc      Pain Edu?      Excl. in GC?    No data found.   Updated Vital Signs BP 156/85 (BP Location: Left Arm)   Pulse 98   Temp 98.5 F (36.9 C) (Oral)   Resp 18   SpO2 98%   Visual Acuity Right Eye Distance:   Left Eye Distance:   Bilateral Distance:    Right Eye Near:   Left Eye Near:    Bilateral Near:     Physical Exam  Constitutional: She is oriented to person, place, and time. She appears well-developed and well-nourished. No distress.  HENT:  Mouth/Throat: Oropharynx is clear and moist. No oropharyngeal  exudate.  Neck: Neck supple.    Cardiovascular: Normal rate, regular rhythm and normal heart sounds.   No murmur heard. Pulmonary/Chest: Effort normal and breath sounds normal. No respiratory distress. She has no wheezes. She has no rales.  Lymphadenopathy:    She has no cervical adenopathy.  Neurological: She is alert and oriented to person, place, and time.     UC Treatments / Results  Labs (all labs ordered are listed, but only abnormal results are displayed) Labs Reviewed - No data to display  EKG  EKG Interpretation None       Radiology No results found.  Procedures Procedures (including critical care time)  Medications Ordered in UC Medications  dexamethasone (DECADRON) injection 10 mg (10 mg Intramuscular Given 08/23/16 1150)     Initial Impression / Assessment and Plan / UC Course  I have reviewed the triage vital signs and the nursing notes.  Pertinent labs & imaging results that were available during my care of the patient were reviewed by me and considered in my medical decision making (see chart for details).  Clinical  Course     I suspect she has a lingering inflammation in the larynx causing her pain and discomfort.  She likely had a viral illness, possibly flu with known contact. Dexamethasone IM given here. If no improvement in 2 days, she will fill the prescription for amoxicillin.  Final Clinical Impressions(s) / UC Diagnoses   Final diagnoses:  Laryngitis    New Prescriptions New Prescriptions   AMOXICILLIN (AMOXIL) 500 MG CAPSULE    Take 1 capsule (500 mg total) by mouth 3 (three) times daily.     Charm RingsErin J Jakyiah Briones, MD 08/23/16 [id number]

## 2016-08-23 NOTE — ED Triage Notes (Signed)
The patient presented to the [facility] with a complaint of a sore throat x 2 weeks. The patient denied any fever. 

## 2016-08-23 NOTE — Discharge Instructions (Signed)
Your symptoms are likely coming from a virus. It sounds like most of your symptoms are improving, except for the throat. We gave you a steroid shot today to help with the inflammation. If you are not feeling better in 2 days, fill the prescription for amoxicillin. Follow-up as needed.

## 2016-11-10 ENCOUNTER — Encounter (HOSPITAL_COMMUNITY): Payer: Self-pay | Admitting: Emergency Medicine

## 2016-11-10 ENCOUNTER — Ambulatory Visit (HOSPITAL_COMMUNITY)
Admission: EM | Admit: 2016-11-10 | Discharge: 2016-11-10 | Disposition: A | Discharge status: Home / Self Care | Payer: 59 | Attending: Family | Admitting: Family

## 2016-11-10 ENCOUNTER — Ambulatory Visit (INDEPENDENT_AMBULATORY_CARE_PROVIDER_SITE_OTHER): Payer: 59

## 2016-11-10 DIAGNOSIS — M25461 Effusion, right knee: Secondary | ICD-10-CM | POA: Diagnosis not present

## 2016-11-10 DIAGNOSIS — M25561 Pain in right knee: Secondary | ICD-10-CM

## 2016-11-10 NOTE — ED Provider Notes (Signed)
CSN: [id number]     Arrival date & time 11/10/16  1201 History   First MD Initiated Contact with Patient 11/10/16 1217     Chief Complaint  Patient presents with  . Knee Pain   (Consider location/radiation/quality/duration/timing/severity/associated sxs/prior Treatment) 61 year old female presents with right knee pain that started 3 days ago. No distinct injury at the time. Has history of arthritis in both knees and has seen an Orthopedic in the past. Yesterday, was walking down stairs and heard something "pop" and now unable to bend or flex her right knee. Took Diclofenac yesterday with minimal relief.     The history is provided by the patient.    Past Medical History:  Diagnosis Date  . Anxiety   . Arthritis    Past Surgical History:  Procedure Laterality Date  . CESAREAN SECTION    . laparascopy    . TONSILLECTOMY     History reviewed. No pertinent family history. Social History  Substance Use Topics  . Smoking status: Never Smoker  . Smokeless tobacco: Never Used  . Alcohol use No   OB History    No data available     Review of Systems  Constitutional: Negative for chills, fatigue and fever.  Cardiovascular: Negative for chest pain.  Musculoskeletal: Positive for arthralgias, gait problem and joint swelling.  Skin: Negative for color change, rash and wound.  Neurological: Negative for weakness, numbness and headaches.  Hematological: Negative for adenopathy.    Allergies  Cefdinir  Home Medications   Prior to Admission medications   Not on File   Meds Ordered and Administered this Visit  Medications - No data to display  BP 161/89 (BP Location: Left Arm)   Pulse 90   Temp 97.8 F (36.6 C) (Oral)   Resp 16   SpO2 98%  No data found.   Physical Exam  Constitutional: She is oriented to person, place, and time. She appears well-developed and well-nourished. No distress.  Cardiovascular: Normal rate and regular rhythm.   Musculoskeletal: She  exhibits edema and tenderness.       Right knee: She exhibits decreased range of motion and swelling. She exhibits no effusion, no ecchymosis, no deformity, no laceration, no erythema, normal alignment and normal patellar mobility. Tenderness found. Medial joint line tenderness noted.  Decreased range of motion- only able to flex or extend knee about 30 degrees. Generalized tenderness of knee- no distinct tenderness point. Pain with movement in posterior popliteal area. Slight swelling of anterior aspect of knee. No sensory or neuro deficits noted.   Neurological: She is alert and oriented to person, place, and time. She has normal strength. No sensory deficit.  Skin: Skin is warm and dry. Capillary refill takes less than 2 seconds. No erythema.  Psychiatric: She has a normal mood and affect. Her behavior is normal. Judgment and thought content normal.    Urgent Care Course     Procedures (including critical care time)  Labs Review Labs Reviewed - No data to display  Imaging Review Dg Knee Complete 4 Views Right  Result Date: 11/10/2016 CLINICAL DATA:  Acute right knee pain for 3 days. Initial encounter. EXAM: RIGHT KNEE - COMPLETE 4+ VIEW COMPARISON:  None. FINDINGS: There is no evidence of acute fracture, subluxation or dislocation. A small knee effusion is present. Moderate tricompartmental joint space narrowing and osteophytosis noted. No suspicious focal bony lesions are identified. IMPRESSION: Small knee effusion without acute bony abnormality. Moderate tricompartmental degenerative changes. Electronically Signed  By: Harmon Pier M.D.   On: 11/10/2016 12:46     Visual Acuity Review  Right Eye Distance:   Left Eye Distance:   Bilateral Distance:    Right Eye Near:   Left Eye Near:    Bilateral Near:         MDM   1. Acute pain of right knee   2. Effusion of bursa of right knee    Reviewed x-ray results with patient. Recommend keep knee elevated. May apply ice for next  24 to 48 hours. Wear Knee immobilizer while awake and use crutches for support. Take Diclofenac that she has at home twice a day as directed. Recommend call her Orthopedic tomorrow for possible joint aspiration and follow-up.      Sudie Grumbling, NP 11/11/16 [phone]

## 2016-11-10 NOTE — Discharge Instructions (Signed)
Recommend keep knee elevated. May apply ice for next 24 to 48 hours. Wear Knee immobilizer while awake and use crutches for support. Take Diclofenac that you have at home twice a day as directed. Call your Orthopedic tomorrow for follow-up.

## 2016-11-10 NOTE — ED Triage Notes (Signed)
[facility] a [facility] right knee pain that started 3 days ago when she was walking down some stairs and heard something "pop."

## 2016-11-19 ENCOUNTER — Ambulatory Visit (HOSPITAL_COMMUNITY)
Admission: EM | Admit: 2016-11-19 | Discharge: 2016-11-19 | Disposition: A | Discharge status: Home / Self Care | Payer: 59 | Attending: Family Medicine | Admitting: Family Medicine

## 2016-11-19 DIAGNOSIS — R509 Fever, unspecified: Secondary | ICD-10-CM | POA: Diagnosis not present

## 2016-11-19 DIAGNOSIS — R519 Headache, unspecified: Secondary | ICD-10-CM

## 2016-11-19 DIAGNOSIS — R51 Headache: Secondary | ICD-10-CM

## 2016-11-19 DIAGNOSIS — R6889 Other general symptoms and signs: Secondary | ICD-10-CM

## 2016-11-19 DIAGNOSIS — R110 Nausea: Secondary | ICD-10-CM

## 2016-11-19 LAB — POCT I-STAT, CHEM 8
BUN: 16 mg/dL (ref 6–20)
Calcium, Ion: 1.14 mmol/L — ABNORMAL LOW (ref 1.15–1.40)
Chloride: 98 mmol/L — ABNORMAL LOW (ref 101–111)
Creatinine, Ser: 0.8 mg/dL (ref 0.44–1.00)
GLUCOSE: 144 mg/dL — AB (ref 65–99)
HCT: 44 % (ref 36.0–46.0)
HEMOGLOBIN: 15 g/dL (ref 12.0–15.0)
Potassium: 4.4 mmol/L (ref 3.5–5.1)
Sodium: 136 mmol/L (ref 135–145)
TCO2: 27 mmol/L (ref 0–100)

## 2016-11-19 MED ORDER — ONDANSETRON HCL 4 MG/2ML IJ SOLN
4.0000 mg | Freq: Once | INTRAMUSCULAR | Status: AC
  Administered 2016-11-19: 4 mg via INTRAVENOUS

## 2016-11-19 MED ORDER — ACETAMINOPHEN 325 MG PO TABS
975.0000 mg | ORAL_TABLET | Freq: Once | ORAL | Status: AC
  Administered 2016-11-19: 975 mg via ORAL

## 2016-11-19 MED ORDER — ONDANSETRON 4 MG PO TBDP
ORAL_TABLET | ORAL | Status: AC
  Filled 2016-11-19: qty 1

## 2016-11-19 MED ORDER — OSELTAMIVIR PHOSPHATE 75 MG PO CAPS: 75.0000 mg | ORAL_CAPSULE | Freq: Two times a day (BID) | ORAL | 0 refills | Status: DC

## 2016-11-19 MED ORDER — KETOROLAC TROMETHAMINE 60 MG/2ML IM SOLN: 60.0000 mg | Freq: Once | INTRAMUSCULAR | Status: DC

## 2016-11-19 MED ORDER — METOCLOPRAMIDE HCL 10 MG PO TABS: 10.0000 mg | ORAL_TABLET | Freq: Four times a day (QID) | ORAL | 0 refills | Status: DC | PRN

## 2016-11-19 MED ORDER — KETOROLAC TROMETHAMINE 30 MG/ML IJ SOLN
30.0000 mg | Freq: Once | INTRAMUSCULAR | Status: AC
  Administered 2016-11-19: 30 mg via INTRAVENOUS

## 2016-11-19 MED ORDER — KETOROLAC TROMETHAMINE 30 MG/ML IJ SOLN
INTRAMUSCULAR | Status: AC
  Filled 2016-11-19: qty 1

## 2016-11-19 MED ORDER — SODIUM CHLORIDE 0.9 % IV SOLN
INTRAVENOUS | Status: DC
  Administered 2016-11-19: 18:00:00 via INTRAVENOUS

## 2016-11-19 MED ORDER — ONDANSETRON HCL 4 MG/2ML IJ SOLN
INTRAMUSCULAR | Status: AC
  Filled 2016-11-19: qty 2

## 2016-11-19 MED ORDER — ONDANSETRON HCL 4 MG/2ML IJ SOLN: 4.0000 mg | Freq: Once | INTRAMUSCULAR | Status: DC

## 2016-11-19 MED ORDER — METOCLOPRAMIDE HCL 5 MG/ML IJ SOLN
INTRAMUSCULAR | Status: AC
  Filled 2016-11-19: qty 2

## 2016-11-19 MED ORDER — ONDANSETRON 4 MG PO TBDP
4.0000 mg | ORAL_TABLET | Freq: Once | ORAL | Status: AC
  Administered 2016-11-19: 4 mg via ORAL

## 2016-11-19 MED ORDER — METOCLOPRAMIDE HCL 5 MG/ML IJ SOLN: 5.0000 mg | Freq: Once | INTRAMUSCULAR | Status: DC

## 2016-11-19 MED ORDER — METOCLOPRAMIDE HCL 5 MG/ML IJ SOLN
10.0000 mg | Freq: Once | INTRAMUSCULAR | Status: AC
  Administered 2016-11-19: 10 mg via INTRAVENOUS

## 2016-11-19 MED ORDER — ACETAMINOPHEN 325 MG PO TABS
ORAL_TABLET | ORAL | Status: AC
  Filled 2016-11-19: qty 3

## 2016-11-19 NOTE — ED Triage Notes (Signed)
C/o flu like sx for three days now States she is nausea, vomiting, sweat chills

## 2016-11-19 NOTE — ED Provider Notes (Signed)
CSN: [id number]     Arrival date & time 11/19/16  1602 History   First MD Initiated Contact with Patient 11/19/16 1726     Chief Complaint  Patient presents with  . flu like sx   (Consider location/radiation/quality/duration/timing/severity/associated sxs/prior Treatment) Patient c/o orthostasis, fever, nausea, and weakness x 3 days.  She took some robitussin DM this am.   The history is provided by the patient.  Fever  Temp source:  Subjective Severity:  Moderate Onset quality:  Sudden Duration:  3 months Timing:  Constant Progression:  Waxing and waning Chronicity:  New Relieved by:  Nothing Worsened by:  Nothing Associated symptoms: chills, headaches, myalgias, nausea and vomiting     Past Medical History:  Diagnosis Date  . Anxiety   . Arthritis    Past Surgical History:  Procedure Laterality Date  . CESAREAN SECTION    . laparascopy    . TONSILLECTOMY     No family history on file. Social History  Substance Use Topics  . Smoking status: Never Smoker  . Smokeless tobacco: Never Used  . Alcohol use No   OB History    No data available     Review of Systems  Constitutional: Positive for chills, fatigue and fever.  HENT: Negative.   Eyes: Negative.   Respiratory: Negative.   Cardiovascular: Negative.   Gastrointestinal: Positive for nausea and vomiting.  Endocrine: Negative.   Genitourinary: Negative.   Musculoskeletal: Positive for myalgias.  Neurological: Positive for headaches.  Hematological: Negative.   Psychiatric/Behavioral: Negative.     Allergies  Cefdinir  Home Medications   Prior to Admission medications   Medication Sig Start Date End Date Taking? Authorizing Provider  metoCLOPramide (REGLAN) 10 MG tablet Take 1 tablet (10 mg total) by mouth every 6 (six) hours as needed for nausea. 11/19/16   Deatra Canter, FNP  oseltamivir (TAMIFLU) 75 MG capsule Take 1 capsule (75 mg total) by mouth every 12 (twelve) hours. 11/19/16   Deatra Canter, FNP   Meds Ordered and Administered this Visit   Medications  0.9 %  sodium chloride infusion ( Intravenous New Bag/Given 11/19/16 1816)  ondansetron (ZOFRAN-ODT) disintegrating tablet 4 mg (4 mg Oral Given 11/19/16 1746)  acetaminophen (TYLENOL) tablet 975 mg (975 mg Oral Given 11/19/16 1842)  ondansetron (ZOFRAN) injection 4 mg (4 mg Intravenous Given 11/19/16 1816)  ketorolac (TORADOL) 30 MG/ML injection 30 mg (30 mg Intravenous Given 11/19/16 1906)  metoCLOPramide (REGLAN) injection 10 mg (10 mg Intravenous Given 11/19/16 1906)    BP 126/75 (BP Location: Right Arm)   Pulse (!) 129   Temp 102 F (38.9 C) (Temporal)   Resp 16   SpO2 97%  No data found.   Physical Exam  Constitutional: She appears well-developed and well-nourished.  Acutely ill  Eyes: Conjunctivae and EOM are normal. Pupils are equal, round, and reactive to light.  Neck: Normal range of motion. Neck supple.  Cardiovascular: Regular rhythm and normal heart sounds.   Tachycardia Orthostasis  Pulmonary/Chest: Effort normal and breath sounds normal.  Abdominal: Soft. Bowel sounds are normal.  Nursing note and vitals reviewed.   Urgent Care Course     Procedures (including critical care time)  Labs Review Labs Reviewed  POCT I-STAT, CHEM 8 - Abnormal; Notable for the following:       Result Value   Chloride 98 (*)    Glucose, Bld 144 (*)    Calcium, Ion 1.14 (*)    All other components within  normal limits    Imaging Review No results found.   Visual Acuity Review  Right Eye Distance:   Left Eye Distance:   Bilateral Distance:    Right Eye Near:   Left Eye Near:    Bilateral Near:         MDM   1. Flu-like symptoms   2. Nausea   3. Fever and chills   4. Nonintractable episodic headache, unspecified headache type    I stat - 8 1 liter NS IV wide open 4mg  zofran Additional 4mg  zofran IV Reglan 5mg  IV Toradol 15mg  IV  Tamiflu 75mg  one po bid x 5 days #10 Reglan 10mg  po tid prn  #21 Push po fluids, rest, tylenol and motrin otc prn as directed for fever, arthralgias, and myalgias.  Follow up prn if sx's continue or persist.  Spent over 45 minutes with patient in clinic.\ Deatra CanterWilliam J Arlisa Leclere, FNP 11/19/16 1932    Deatra CanterWilliam J Alden Bensinger, FNP 11/19/16 [phone]

## 2017-04-15 ENCOUNTER — Other Ambulatory Visit: Payer: Self-pay | Admitting: Adult Health

## 2017-04-15 DIAGNOSIS — N6310 Unspecified lump in the right breast, unspecified quadrant: Secondary | ICD-10-CM

## 2017-04-21 ENCOUNTER — Ambulatory Visit
Admission: RE | Admit: 2017-04-21 | Discharge: 2017-04-21 | Disposition: A | Discharge status: Home / Self Care | Payer: 59 | Source: Ambulatory Visit | Attending: Adult Health | Admitting: Adult Health

## 2017-04-21 DIAGNOSIS — N6310 Unspecified lump in the right breast, unspecified quadrant: Secondary | ICD-10-CM

## 2017-06-03 ENCOUNTER — Other Ambulatory Visit: Payer: Self-pay | Admitting: Sports Medicine

## 2017-06-03 DIAGNOSIS — M5412 Radiculopathy, cervical region: Secondary | ICD-10-CM

## 2017-06-03 DIAGNOSIS — M542 Cervicalgia: Secondary | ICD-10-CM

## 2017-06-18 ENCOUNTER — Ambulatory Visit
Admission: RE | Admit: 2017-06-18 | Discharge: 2017-06-18 | Disposition: A | Discharge status: Home / Self Care | Payer: 59 | Source: Ambulatory Visit | Attending: Sports Medicine | Admitting: Sports Medicine

## 2017-06-18 DIAGNOSIS — M5412 Radiculopathy, cervical region: Secondary | ICD-10-CM

## 2017-06-18 DIAGNOSIS — M542 Cervicalgia: Secondary | ICD-10-CM

## 2018-03-10 ENCOUNTER — Other Ambulatory Visit: Payer: Self-pay | Admitting: Adult Health

## 2018-03-10 DIAGNOSIS — N649 Disorder of breast, unspecified: Secondary | ICD-10-CM

## 2018-04-27 ENCOUNTER — Ambulatory Visit
Admission: RE | Admit: 2018-04-27 | Discharge: 2018-04-27 | Disposition: A | Discharge status: Home / Self Care | Payer: 59 | Source: Ambulatory Visit | Attending: Adult Health | Admitting: Adult Health

## 2018-04-27 DIAGNOSIS — N649 Disorder of breast, unspecified: Secondary | ICD-10-CM

## 2019-03-26 ENCOUNTER — Other Ambulatory Visit: Payer: Self-pay | Admitting: Adult Health

## 2019-03-26 DIAGNOSIS — Z1231 Encounter for screening mammogram for malignant neoplasm of breast: Secondary | ICD-10-CM

## 2019-05-11 ENCOUNTER — Ambulatory Visit
Admission: RE | Admit: 2019-05-11 | Discharge: 2019-05-11 | Disposition: A | Discharge status: Home / Self Care | Payer: 59 | Source: Ambulatory Visit | Attending: Adult Health | Admitting: Adult Health

## 2019-05-11 ENCOUNTER — Other Ambulatory Visit: Payer: Self-pay

## 2019-05-11 DIAGNOSIS — Z1231 Encounter for screening mammogram for malignant neoplasm of breast: Secondary | ICD-10-CM

## 2020-06-23 ENCOUNTER — Other Ambulatory Visit: Payer: Self-pay | Admitting: Adult Health

## 2020-06-23 DIAGNOSIS — Z1231 Encounter for screening mammogram for malignant neoplasm of breast: Secondary | ICD-10-CM

## 2020-07-06 ENCOUNTER — Other Ambulatory Visit: Payer: Self-pay

## 2020-07-06 ENCOUNTER — Ambulatory Visit
Admission: RE | Admit: 2020-07-06 | Discharge: 2020-07-06 | Disposition: A | Discharge status: Home / Self Care | Payer: Medicare Other | Source: Ambulatory Visit | Attending: Endocrinology | Admitting: Endocrinology

## 2020-07-06 DIAGNOSIS — Z1231 Encounter for screening mammogram for malignant neoplasm of breast: Secondary | ICD-10-CM

## 2020-08-22 ENCOUNTER — Encounter: Payer: Self-pay | Admitting: Physician Assistant

## 2020-09-14 ENCOUNTER — Ambulatory Visit (INDEPENDENT_AMBULATORY_CARE_PROVIDER_SITE_OTHER): Payer: Medicare Other | Admitting: Physician Assistant

## 2020-09-14 ENCOUNTER — Encounter: Payer: Self-pay | Admitting: Physician Assistant

## 2020-09-14 VITALS — BP 120/70 | HR 87 | Ht 65.5 in | Wt 244.0 lb

## 2020-09-14 DIAGNOSIS — Z1211 Encounter for screening for malignant neoplasm of colon: Secondary | ICD-10-CM

## 2020-09-14 DIAGNOSIS — R1310 Dysphagia, unspecified: Secondary | ICD-10-CM

## 2020-09-14 DIAGNOSIS — R1013 Epigastric pain: Secondary | ICD-10-CM

## 2020-09-14 MED ORDER — PLENVU 140 G PO SOLR: 1.0000 | ORAL | 0 refills | Status: DC

## 2020-09-14 MED ORDER — HYOSCYAMINE SULFATE 0.125 MG SL SUBL: 0.1250 mg | SUBLINGUAL_TABLET | SUBLINGUAL | 2 refills | Status: DC | PRN

## 2020-09-14 NOTE — Patient Instructions (Signed)
If you are age 65 or older, your body mass index should be between 23-30. Your Body mass index is 39.99 kg/m. If this is out of the aforementioned range listed, please consider follow up with your Primary Care Provider.  If you are age 24 or younger, your body mass index should be between 19-25. Your Body mass index is 39.99 kg/m. If this is out of the aformentioned range listed, please consider follow up with your Primary Care Provider.   You have been scheduled for an endoscopy and colonoscopy. Please follow the written instructions given to you at your visit today. Please pick up your prep supplies at the pharmacy within the next 1-3 days. If you use inhalers (even only as needed), please bring them with you on the day of your procedure.  You have been scheduled for an abdominal ultrasound at [facility] (1st floor of hospital) on 09/20/20 at 9:00 am. Please arrive 15 minutes prior to your appointment for registration. Make certain not to have anything to eat or drink starting at midnight. Should you need to reschedule your appointment, please contact radiology at [phone]. This test typically takes about 30 minutes to perform.  START hyoscyamine 0.125 mg 1 tablet on the tongue every 4 hours as needed for abdominal pain and spasms.  Follow up pending the results of your Ultrasound and procedures.  Thank you for entrusting me with your care and choosing [facility].  Amy Esterwood, PA-C

## 2020-09-14 NOTE — Progress Notes (Signed)
Subjective:    Patient ID: Sarah Spencer, female    DOB: 05/20/1956, 64 y.o.   MRN: [id number]  HPI Sarah Spencer is a pleasant 65 year old white female, new to GI today referred by Alvester Chou, NP.  Patient wishes to have repeat colonoscopy for screening and also has complaints of dysphagia. She is known remotely to Dr. Carlean Purl and was seen here in 2003 and underwent EGD and colonoscopy. Colonoscopy at that time was negative other than internal and external hemorrhoids. EGD showed distal esophageal stricture at the GE junction and she was [facility] dilated to 50 mm.  Patient says she has had recurrent dysphagia symptoms over the past few years.  Primarily notes difficulty with pills getting stuck.  She says she drinks a lot of fluids with her meals and may have some mild sensation of solid food dysphagia but no episodes requiring regurgitation, and no severe solid food dysphagia symptoms.  She denies any regular heartburn or indigestion, occasionally uses an antacid. She does have some trouble with intermittent constipation and uses a stool softener on a as needed basis, she will occasionally have symptomatic hemorrhoids with blood noted on the tissue off and on.  No ongoing anorectal discomfort. She also has been having episodes of significant epigastric/subxiphoid pain that will occur a couple of times per week and radiate straight through into her back.  She describes this as an achy discomfort, frequently postprandially.  No associated nausea or vomiting, no associated diarrhea abdominal cramping etc.  She says sometimes she just has to lay down until pain subsides and may last for a few hours.  This has been going on for years as well.  No family history of colon cancer or polyps, other medical problems include obesity, anxiety, hyperlipidemia and osteoarthritis.  Review of Systems Pertinent positive and negative review of systems were noted in the above HPI section.  All other review of systems was  otherwise negative.  Outpatient Encounter Medications as of 09/14/2020  Medication Sig  . albuterol (VENTOLIN HFA) 108 (90 Base) MCG/ACT inhaler Inhale 2 puffs into the lungs as needed.  . Cholecalciferol (VITAMIN D3) 50 MCG (2000 UT) TABS Take 1 tablet by mouth daily.  . clonazePAM (KLONOPIN) 0.5 MG tablet Take 0.5 mg by mouth 3 (three) times daily as needed.  . Coenzyme Q10 (COQ10) 100 MG CAPS Take 1 capsule by mouth daily.  . diclofenac (VOLTAREN) 75 MG EC tablet Take 75 mg by mouth daily.  . Garlic 6389 MG CAPS Take 2 capsules by mouth at bedtime.  . montelukast (SINGULAIR) 10 MG tablet Take 10 mg by mouth daily.  Marland Kitchen OVER THE COUNTER MEDICATION Cinnamon 10105m plus chromium 4034m- 2 daily  . simvastatin (ZOCOR) 10 MG tablet Take 10 mg by mouth at bedtime.  . Zinc 50 MG TABS Take 1 tablet by mouth daily.  . [DISCONTINUED] clonazePAM (KLONOPIN) 0.5 MG tablet Take 1 tablet by mouth daily.  . hyoscyamine (LEVSIN SL) 0.125 MG SL tablet Place 1 tablet (0.125 mg total) under the tongue every 4 (four) hours as needed (Abdominal pain and spasms).  . Marland KitchenEG-KCl-NaCl-NaSulf-Na Asc-C (PLENVU) 140 g SOLR Take 1 kit by mouth as directed.  . [DISCONTINUED] metoCLOPramide (REGLAN) 10 MG tablet Take 1 tablet (10 mg total) by mouth every 6 (six) hours as needed for nausea.  . [DISCONTINUED] oseltamivir (TAMIFLU) 75 MG capsule Take 1 capsule (75 mg total) by mouth every 12 (twelve) hours.   No facility-administered encounter medications on file as of 09/14/2020.  Allergies  Allergen Reactions  . Cefdinir    There are no problems to display for this patient.  Social History   Socioeconomic History  . Marital status: Widowed    Spouse name: Not on file  . Number of children: 4  . Years of education: Not on file  . Highest education level: Not on file  Occupational History  . Occupation: retired  Tobacco Use  . Smoking status: Never Smoker  . Smokeless tobacco: Never Used  Vaping Use  . Vaping  Use: Never used  Substance and Sexual Activity  . Alcohol use: No  . Drug use: No  . Sexual activity: Not Currently  Other Topics Concern  . Not on file  Social History Narrative  . Not on file   Social Determinants of Health   Financial Resource Strain:   . Difficulty of Paying Living Expenses: Not on file  Food Insecurity:   . Worried About Charity fundraiser in the Last Year: Not on file  . Ran Out of Food in the Last Year: Not on file  Transportation Needs:   . Lack of Transportation (Medical): Not on file  . Lack of Transportation (Non-Medical): Not on file  Physical Activity:   . Days of Exercise per Week: Not on file  . Minutes of Exercise per Session: Not on file  Stress:   . Feeling of Stress : Not on file  Social Connections:   . Frequency of Communication with Friends and Family: Not on file  . Frequency of Social Gatherings with Friends and Family: Not on file  . Attends Religious Services: Not on file  . Active Member of Clubs or Organizations: Not on file  . Attends Archivist Meetings: Not on file  . Marital Status: Not on file  Intimate Partner Violence:   . Fear of Current or Ex-Partner: Not on file  . Emotionally Abused: Not on file  . Physically Abused: Not on file  . Sexually Abused: Not on file    Sarah Spencer's family history includes Heart disease in her father, sister, and sister; Other in her brother; Stroke in her sister.      Objective:    Vitals:   09/14/20 1510  BP: 120/70  Pulse: 87    Physical Exam Well-developed well-nourished older WF in no acute distress.  Height, [id number], BMI 39.9  HEENT; nontraumatic normocephalic, EOMI, PER R LA, sclera anicteric. Oropharynx; not examined today Neck; supple, no JVD Cardiovascular; regular rate and rhythm with S1-S2, no murmur rub or gallop Pulmonary; Clear bilaterally, she has extreme sensitivity to palpation over the left costal margin Abdomen; soft, obese, nontender,  nondistended, no palpable mass or hepatosplenomegaly, bowel sounds are active Rectal; not done today Skin; benign exam, no jaundice rash or appreciable lesions Extremities; no clubbing cyanosis or edema skin warm and dry Neuro/Psych; alert and oriented x4, grossly nonfocal mood and affect appropriate       Assessment & Plan:   #45 64 year old white female overdue for colon cancer screening, average risk Negative colonoscopy 2003 #2 previously documented internal and external hemorrhoids, occasionally symptomatic with small-volume hematochezia #3 mild constipation #4 dysphagia to pills and mild solid food dysphagia, symptomatic over the past few years. She did have distal esophageal stricture noted in 2003 at EGD and had [facility] dilation to 50 mm. Suspect recurrent distal esophageal stricture  #5 recurrent epigastric/subxiphoid pain radiating through to the back occurring several times per week symptoms present over the past few years  Unclear whether this may be musculoskeletal as she does have some costochondritis type symptoms, consider esophageal spasm, rule out biliary colic  #6, hyperlipidemia #7.  Anxiety #8.  Osteoarthritis  Plan; Patient will be scheduled for colonoscopy and EGD with possible dilation with Dr. Loletha Carrow.  Patient specifically requested a Friday and Dr. Carlean Purl has no availability on Fridays. Both procedures were discussed in detail with patient including indications risk and benefits and she is agreeable to proceed. She has completed COVID-19 vaccination. We will also schedule for upper abdominal ultrasound. We will give her a trial of Levsin sublingual every 4 hours to try for her recurrent episodes of subxiphoid/epigastric pain. She is reluctant to take an acid blocker at this point because of concerns that she has had regarding safety.  Depending on results of EGD, she may benefit from famotidine or PPI if she is willing to take.  We discussed the association  between distal esophageal strictures and chronic GERD.   Reinhardt Licausi Genia Harold PA-C 09/14/2020   Cc: Alvester Chou, NP

## 2020-09-19 NOTE — Progress Notes (Signed)
____________________________________________________________  Attending physician addendum:  Thank you for sending this case to me. I have reviewed the entire note and agree with the plan.  I agree the pain may be biliary colic.  EGD and colonoscopy appropriate for other symptoms.  Amada Jupiter, MD  ____________________________________________________________

## 2020-09-20 ENCOUNTER — Other Ambulatory Visit: Payer: Self-pay

## 2020-09-20 ENCOUNTER — Ambulatory Visit (HOSPITAL_COMMUNITY)
Admission: RE | Admit: 2020-09-20 | Discharge: 2020-09-20 | Disposition: A | Discharge status: Home / Self Care | Payer: Medicare Other | Source: Ambulatory Visit | Attending: Physician Assistant | Admitting: Physician Assistant

## 2020-09-20 DIAGNOSIS — R1013 Epigastric pain: Secondary | ICD-10-CM | POA: Insufficient documentation

## 2020-09-20 DIAGNOSIS — R1310 Dysphagia, unspecified: Secondary | ICD-10-CM | POA: Diagnosis present

## 2020-09-21 ENCOUNTER — Telehealth: Payer: Self-pay | Admitting: Physician Assistant

## 2020-09-21 NOTE — Telephone Encounter (Signed)
Patient called provided her with the [place] results per Amy Esterwood. Patient understood and had no further questions at this time.

## 2020-10-14 DIAGNOSIS — R0602 Shortness of breath: Secondary | ICD-10-CM

## 2020-10-14 HISTORY — DX: Shortness of breath: R06.02

## 2020-10-20 ENCOUNTER — Other Ambulatory Visit: Payer: Self-pay

## 2020-10-20 ENCOUNTER — Encounter: Payer: Self-pay | Admitting: Gastroenterology

## 2020-10-20 ENCOUNTER — Ambulatory Visit (AMBULATORY_SURGERY_CENTER): Payer: Medicare Other | Admitting: Gastroenterology

## 2020-10-20 VITALS — BP 129/71 | HR 76 | Temp 98.2°F | Resp 13 | Ht 65.0 in | Wt 244.0 lb

## 2020-10-20 DIAGNOSIS — K2100 Gastro-esophageal reflux disease with esophagitis, without bleeding: Secondary | ICD-10-CM

## 2020-10-20 DIAGNOSIS — K222 Esophageal obstruction: Secondary | ICD-10-CM | POA: Diagnosis not present

## 2020-10-20 DIAGNOSIS — Z1211 Encounter for screening for malignant neoplasm of colon: Secondary | ICD-10-CM | POA: Diagnosis not present

## 2020-10-20 DIAGNOSIS — R1319 Other dysphagia: Secondary | ICD-10-CM

## 2020-10-20 DIAGNOSIS — R1013 Epigastric pain: Secondary | ICD-10-CM

## 2020-10-20 MED ORDER — SODIUM CHLORIDE 0.9 % IV SOLN
500.0000 mL | INTRAVENOUS | Status: DC
Start: 2020-10-20 — End: 2020-10-20

## 2020-10-20 NOTE — Op Note (Signed)
[facility] Patient Name: Sarah Spencer Procedure Date: 10/20/2020 2:35 PM MRN: [id number] Endoscopist: Sherilyn Cooter L. Myrtie Neither , MD Age: 65 Referring MD:  Date of Birth: 07-29-1956 Gender: Female Account #: [id number] Procedure:                Upper GI endoscopy Indications:              Epigastric/subxyphoid abdominal pain that radiates                            to the back (many years, intermittent - recent US                            without gallstones), Esophageal dysphagia                            (primarily pills, sometimes food - previous                            esophageal dilation performed) Medicines:                Monitored Anesthesia Care Procedure:                Pre-Anesthesia Assessment:                           - Prior to the procedure, a History and Physical                            was performed, and patient medications and                            allergies were reviewed. The patient's tolerance of                            previous anesthesia was also reviewed. The risks                            and benefits of the procedure and the sedation                            options and risks were discussed with the patient.                            All questions were answered, and informed consent                            was obtained. Prior Anticoagulants: The patient has                            taken no previous anticoagulant or antiplatelet                            agents. ASA Grade Assessment: III - A patient with  severe systemic disease. After reviewing the risks                            and benefits, the patient was deemed in                            satisfactory condition to undergo the procedure.                           After obtaining informed consent, the endoscope was                            passed under direct vision. Throughout the                            procedure, the patient's blood  pressure, pulse, and                            oxygen saturations were monitored continuously. The                            Endoscope was introduced through the mouth, and                            advanced to the second part of duodenum. The upper                            GI endoscopy was accomplished without difficulty.                            The patient tolerated the procedure well. Scope In: Scope Out: Findings:                 A widely patent Schatzki ring was found at the                            gastroesophageal junction.                           LA Grade A (one or more mucosal breaks less than 5                            mm, not extending between tops of 2 mucosal folds)                            esophagitis with no bleeding was found at the                            gastroesophageal junction.                           The exam of the esophagus was otherwise normal.  The stomach was normal.                           The cardia and gastric fundus were normal on                            retroflexion.                           The examined duodenum was normal. Complications:            No immediate complications. Estimated Blood Loss:     Estimated blood loss: none. Impression:               - Widely patent Schatzki ring.                           - LA Grade A reflux esophagitis with no bleeding.                           - Normal stomach.                           - Normal examined duodenum.                           - No specimens collected.                           Slight esophageal ring not sufficient to explain                            dysphagia, which may be from reflux-related                            dysmoility. Recommendation:           - Patient has a contact number available for                            emergencies. The signs and symptoms of potential                            delayed complications were discussed with  the                            patient. Return to normal activities tomorrow.                            Written discharge instructions were provided to the                            patient.                           - Resume previous diet.                           -  Continue present medications.                           - Perform a CCK-HIDA scan at appointment to be                            scheduled.                           - Consider a trial of acid suppression therapy                            (patient has thus far been reluctant to do so) to                            see if improvement in dysphagia and/or pain. Aundray Cartlidge L. Myrtie Neither, MD 10/20/2020 3:46:23 PM This report has been signed electronically.

## 2020-10-20 NOTE — Progress Notes (Signed)
Vs SM  

## 2020-10-20 NOTE — Patient Instructions (Signed)
YOU HAD AN ENDOSCOPIC PROCEDURE TODAY AT THE [facility]:   Refer to the procedure report that was given to you for any specific questions about what was found during the examination.  If the procedure report does not answer your questions, please call your gastroenterologist to clarify.  If you requested that your care partner not be given the details of your procedure findings, then the procedure report has been included in a sealed envelope for you to review at your convenience later.  YOU SHOULD EXPECT: Some feelings of bloating in the abdomen. Passage of more gas than usual.  Walking can help get rid of the air that was put into your GI tract during the procedure and reduce the bloating. If you had a lower endoscopy (such as a colonoscopy or flexible sigmoidoscopy) you may notice spotting of blood in your stool or on the toilet paper. If you underwent a bowel prep for your procedure, you may not have a normal bowel movement for a few days.  Please Note:  You might notice some irritation and congestion in your nose or some drainage.  This is from the oxygen used during your procedure.  There is no need for concern and it should clear up in a day or so.  SYMPTOMS TO REPORT IMMEDIATELY:   Following lower endoscopy (colonoscopy or flexible sigmoidoscopy):  Excessive amounts of blood in the stool  Significant tenderness or worsening of abdominal pains  Swelling of the abdomen that is new, acute  Fever of 100F or higher   Following upper endoscopy (EGD)  Vomiting of blood or coffee ground material  New chest pain or pain under the shoulder blades  Painful or persistently difficult swallowing  New shortness of breath  Fever of 100F or higher  Black, tarry-looking stools  For urgent or emergent issues, a gastroenterologist can be reached at any hour by calling [phone]. Do not use MyChart messaging for urgent concerns.    DIET:  We do recommend a small meal at first, but  then you may proceed to your regular diet.  Drink plenty of fluids but you should avoid alcoholic beverages for 24 hours.  ACTIVITY:  You should plan to take it easy for the rest of today and you should NOT DRIVE or use heavy machinery until tomorrow (because of the sedation medicines used during the test).    FOLLOW UP: Our staff will call the number listed on your records 48-72 hours following your procedure to check on you and address any questions or concerns that you may have regarding the information given to you following your procedure. If we do not reach you, we will leave a message.  We will attempt to reach you two times.  During this call, we will ask if you have developed any symptoms of COVID 19. If you develop any symptoms (ie: fever, flu-like symptoms, shortness of breath, cough etc.) before then, please call [phone].  If you test positive for Covid 19 in the 2 weeks post procedure, please call and report this information to us.    If any biopsies were taken you will be contacted by phone or by letter within the next 1-3 weeks.  Please call us at [phone] if you have not heard about the biopsies in 3 weeks.    SIGNATURES/CONFIDENTIALITY: You and/or your care partner have signed paperwork which will be entered into your electronic medical record.  These signatures attest to the fact that that the information above on   your After Visit Summary has been reviewed and is understood.  Full responsibility of the confidentiality of this discharge information lies with you and/or your care-partner. 

## 2020-10-20 NOTE — Op Note (Signed)
[facility] Patient Name: Sarah Spencer Procedure Date: 10/20/2020 2:35 PM MRN: [id number] Endoscopist: Mallie Mussel L. Loletha Carrow , MD Age: 65 Referring MD:  Date of Birth: [birth year] Gender: Female Account #: [id number] Procedure:                Colonoscopy Indications:              Screening for colorectal malignant neoplasm (last                            colonoscopy over 10 years prior) Medicines:                Monitored Anesthesia Care Procedure:                Pre-Anesthesia Assessment:                           - Prior to the procedure, a History and Physical                            was performed, and patient medications and                            allergies were reviewed. The patient's tolerance of                            previous anesthesia was also reviewed. The risks                            and benefits of the procedure and the sedation                            options and risks were discussed with the patient.                            All questions were answered, and informed consent                            was obtained. Prior Anticoagulants: The patient has                            taken no previous anticoagulant or antiplatelet                            agents. ASA Grade Assessment: III - A patient with                            severe systemic disease. After reviewing the risks                            and benefits, the patient was deemed in                            satisfactory condition to undergo the procedure.  After obtaining informed consent, the colonoscope                            was passed under direct vision. Throughout the                            procedure, the patient's blood pressure, pulse, and                            oxygen saturations were monitored continuously. The                            Olympus CF-HQ190L [id number]) Colonoscope was                            introduced through the anus  and advanced to the the                            cecum, identified by appendiceal orifice and                            ileocecal valve. The colonoscopy was performed with                            difficulty due to a redundant colon and significant                            looping. Successful completion of the procedure was                            aided by changing the patient to a supine position                            and using manual pressure. The patient tolerated                            the procedure well. The quality of the bowel                            preparation was excellent. The ileocecal valve,                            appendiceal orifice, and rectum were photographed.                            The bowel preparation used was Plenvu. Scope In: 3:06:28 PM Scope Out: 3:23:24 PM Scope Withdrawal Time: 0 hours 8 minutes 1 second  Total Procedure Duration: 0 hours 16 minutes 56 seconds  Findings:                 The perianal and digital rectal examinations were                            normal.  The colon (entire examined portion) was redundant.                           A few diverticula were found in the sigmoid colon.                           The exam was otherwise without abnormality on                            direct and retroflexion views. Complications:            No immediate complications. Estimated Blood Loss:     Estimated blood loss: none. Impression:               - Redundant colon.                           - Diverticulosis in the sigmoid colon.                           - The examination was otherwise normal on direct                            and retroflexion views.                           - No specimens collected. Recommendation:           - Patient has a contact number available for                            emergencies. The signs and symptoms of potential                            delayed complications  were discussed with the                            patient. Return to normal activities tomorrow.                            Written discharge instructions were provided to the                            patient.                           - Resume previous diet.                           - Continue present medications.                           - Repeat colonoscopy in 10 years for screening                            purposes.                           -  See the other procedure note for documentation of                            additional recommendations. Henry L. Myrtie Neither, MD 10/20/2020 3:38:21 PM This report has been signed electronically.

## 2020-10-20 NOTE — Progress Notes (Signed)
Report to PACU, RN, vss, BBS= Clear.  

## 2020-10-23 ENCOUNTER — Other Ambulatory Visit: Payer: Self-pay

## 2020-10-23 ENCOUNTER — Telehealth: Payer: Self-pay

## 2020-10-23 DIAGNOSIS — R1013 Epigastric pain: Secondary | ICD-10-CM

## 2020-10-23 NOTE — Telephone Encounter (Signed)
Patient has been scheduled for a CCK-HIDA scan at [facility] on Tuesday, 11/07/2020 at 9:30 AM, patient is to arrive at 9 AM. NPO after midnight.   Patient is scheduled for a follow up on Tuesday, 12/05/2020 at 2:20 PM.   Lm on vm for patient to return call.

## 2020-10-23 NOTE — Telephone Encounter (Signed)
-----   Message from Charlie Pitter III, MD sent at 10/20/2020  4:21 PM EST ----- Please schedule a CCK-HIDA scan for this patient and an office appointment with me in mid to late February.  - HD

## 2020-10-24 ENCOUNTER — Telehealth: Payer: Self-pay

## 2020-10-24 NOTE — Telephone Encounter (Signed)
Attempted to reach patient for post-procedure f/u call. No answer. Left message that we will make another attempt to reach her later today and for her to please not hesitate to call us if she has any questions/concerns regarding her care. 

## 2020-10-24 NOTE — Telephone Encounter (Signed)
Second post procedure follow up call, no answer 

## 2020-10-24 NOTE — Telephone Encounter (Signed)
Spoke with patient in regards to her appointments below. Provided patient with information for both upcoming appointments and restrictions for HIDA scan. Patient verbalized understanding of all information and had no concerns at the end of the call.

## 2020-11-06 DIAGNOSIS — U071 COVID-19: Secondary | ICD-10-CM

## 2020-11-06 HISTORY — DX: COVID-19: U07.1

## 2020-11-07 ENCOUNTER — Ambulatory Visit (HOSPITAL_COMMUNITY): Admission: RE | Admit: 2020-11-07 | Payer: Medicare Other | Source: Ambulatory Visit

## 2020-11-12 ENCOUNTER — Ambulatory Visit (HOSPITAL_COMMUNITY)
Admission: EM | Admit: 2020-11-12 | Discharge: 2020-11-12 | Disposition: A | Discharge status: Home / Self Care | Payer: Medicare Other | Attending: Emergency Medicine | Admitting: Emergency Medicine

## 2020-11-12 ENCOUNTER — Other Ambulatory Visit: Payer: Self-pay

## 2020-11-12 ENCOUNTER — Encounter (HOSPITAL_COMMUNITY): Payer: Self-pay | Admitting: *Deleted

## 2020-11-12 DIAGNOSIS — R0981 Nasal congestion: Secondary | ICD-10-CM

## 2020-11-12 DIAGNOSIS — B9789 Other viral agents as the cause of diseases classified elsewhere: Secondary | ICD-10-CM | POA: Diagnosis not present

## 2020-11-12 DIAGNOSIS — J329 Chronic sinusitis, unspecified: Secondary | ICD-10-CM | POA: Diagnosis not present

## 2020-11-12 DIAGNOSIS — U071 COVID-19: Secondary | ICD-10-CM | POA: Diagnosis not present

## 2020-11-12 MED ORDER — PREDNISONE 10 MG (21) PO TBPK: ORAL_TABLET | Freq: Every day | ORAL | 0 refills | Status: DC

## 2020-11-12 NOTE — ED Triage Notes (Signed)
Pt reports she had a Positive home COVID test on Monday . Pt 's sore throat started on Monday and sinus pain. Pt denies any fever.

## 2020-11-12 NOTE — ED Provider Notes (Signed)
Sarah Spencer    CSN: [id number] Arrival date & time: 11/12/20  1415      History   Chief Complaint Chief Complaint  Patient presents with  . Sore Throat  . Facial Pain  . Otalgia    HPI Sarah Spencer is a 65 y.o. female.  Patient presents with right side sinus congestion and pressure x 1.5 weeks. She tested positive for COVID at home on 11/06/2020; symptom onset 11/02/2020. She states she had a sore throat at the beginning of her symptoms but this resolved spontaneously. She denies fever, rash, cough, shortness of breath, vomiting, diarrhea, or other symptoms. Treatment attempted at home with Flonase and Singulair. Her medical history includes allergies, GERD, arthritis, obesity, depression, anxiety.  The history is provided by the patient and medical records.    Past Medical History:  Diagnosis Date  . Allergies   . Anxiety   . Arthritis   . Depression   . GERD (gastroesophageal reflux disease)   . Hyperlipidemia   . Obesity   . Osteoporosis   . Vitamin D deficiency     There are no problems to display for this patient.   Past Surgical History:  Procedure Laterality Date  . CERVICAL DISC SURGERY    . CESAREAN SECTION     x 1  . COLONOSCOPY    . ESOPHAGOGASTRODUODENOSCOPY    . laparascopy    . laporoscopy    . TONSILLECTOMY      OB History   No obstetric history on file.      Home Medications    Prior to Admission medications   Medication Sig Start Date End Date Taking? Authorizing Provider  Cholecalciferol (VITAMIN D3) 50 MCG (2000 UT) TABS Take 1 tablet by mouth daily.   Yes [provider]  clonazePAM (KLONOPIN) 0.5 MG tablet Take 0.5 mg by mouth 3 (three) times daily as needed. 07/26/20  Yes [provider]  Coenzyme Q10 (COQ10) 100 MG CAPS Take 1 capsule by mouth daily.   Yes [provider]  diclofenac (VOLTAREN) 75 MG EC tablet Take 75 mg by mouth daily. 07/06/20  Yes [provider]  Garlic 3474 MG  CAPS Take 2 capsules by mouth at bedtime.   Yes [provider]  montelukast (SINGULAIR) 10 MG tablet Take 10 mg by mouth daily. 08/15/20  Yes [provider]  predniSONE (STERAPRED UNI-PAK 21 TAB) 10 MG (21) TBPK tablet Take by mouth daily. As directed 11/12/20  Yes Sharion Balloon, NP  simvastatin (ZOCOR) 10 MG tablet Take 10 mg by mouth at bedtime. 07/06/20  Yes [provider]  albuterol (VENTOLIN HFA) 108 (90 Base) MCG/ACT inhaler Inhale 2 puffs into the lungs as needed. Patient not taking: No sig reported    [provider]  buPROPion (WELLBUTRIN SR) 150 MG 12 hr tablet bupropion HCl SR 150 mg tablet,12 hr sustained-release    [provider]  citalopram (CELEXA) 20 MG tablet citalopram 20 mg tablet    [provider]  hyoscyamine (LEVSIN SL) 0.125 MG SL tablet Place 1 tablet (0.125 mg total) under the tongue every 4 (four) hours as needed (Abdominal pain and spasms). Patient not taking: No sig reported 09/14/20   Esterwood, Amy S, PA-C  OVER THE COUNTER MEDICATION Cinnamon 1079m plus chromium 4072m- 2 daily Patient not taking: No sig reported    [provider]  PEG-KCl-NaCl-NaSulf-Na Asc-C (PLENVU) 140 g SOLR Take 1 kit by mouth as directed. 09/14/20   Esterwood,  Amy S, PA-C  Zinc 50 MG TABS Take 1 tablet by mouth daily.    [provider]    Family History Family History  Problem Relation Age of Onset  . Heart disease Father   . Heart disease Sister   . Other Brother        step brother  . Heart disease Sister   . Stroke Sister        caused by medicine  . Colon cancer Neg Hx   . Esophageal cancer Neg Hx   . Rectal cancer Neg Hx     Social History Social History   Tobacco Use  . Smoking status: Never Smoker  . Smokeless tobacco: Never Used  Vaping Use  . Vaping Use: Never used  Substance Use Topics  . Alcohol use: No  . Drug use: No     Allergies   Cefdinir   Review of Systems Review of  Systems  Constitutional: Negative for chills and fever.  HENT: Positive for congestion and sinus pressure. Negative for ear pain and sore throat.   Eyes: Negative for pain and visual disturbance.  Respiratory: Negative for cough and shortness of breath.   Cardiovascular: Negative for chest pain and palpitations.  Gastrointestinal: Negative for abdominal pain, diarrhea and vomiting.  Genitourinary: Negative for dysuria and hematuria.  Musculoskeletal: Negative for arthralgias and back pain.  Skin: Negative for color change and rash.  Neurological: Negative for seizures and syncope.  All other systems reviewed and are negative.    Physical Exam Triage Vital Signs ED Triage Vitals  Enc Vitals Group     BP 11/12/20 1550 119/72     Pulse Rate 11/12/20 1550 91     Resp 11/12/20 1550 18     Temp 11/12/20 1550 98.4 F (36.9 C)     Temp Source 11/12/20 1550 Oral     SpO2 11/12/20 1550 96 %     Weight --      Height --      Head Circumference --      Peak Flow --      Pain Score 11/12/20 1543 2     Pain Loc --      Pain Edu? --      Excl. in [place]? --    No data found.  Updated Vital Signs BP 119/72 (BP Location: Right Arm)   Pulse 91   Temp 98.4 F (36.9 C) (Oral)   Resp 18   SpO2 96%   Visual Acuity Right Eye Distance:   Left Eye Distance:   Bilateral Distance:    Right Eye Near:   Left Eye Near:    Bilateral Near:     Physical Exam Vitals and nursing note reviewed.  Constitutional:      General: She is not in acute distress.    Appearance: She is well-developed and well-nourished. She is not ill-appearing.  HENT:     Head: Normocephalic and atraumatic.     Right Ear: Tympanic membrane normal.     Left Ear: Tympanic membrane normal.     Nose: Nose normal.     Mouth/Throat:     Mouth: Mucous membranes are moist.     Pharynx: Oropharynx is clear.  Eyes:     Conjunctiva/sclera: Conjunctivae normal.  Cardiovascular:     Rate and Rhythm: Normal rate and regular  rhythm.     Heart sounds: Normal heart sounds. No murmur heard.   Pulmonary:     Effort: Pulmonary effort is  normal. No respiratory distress.     Breath sounds: Normal breath sounds.  Abdominal:     Palpations: Abdomen is soft.     Tenderness: There is no abdominal tenderness. There is no guarding or rebound.  Musculoskeletal:        General: No edema.     Cervical back: Neck supple.  Skin:    General: Skin is warm and dry.  Neurological:     General: No focal deficit present.     Mental Status: She is alert and oriented to person, place, and time.     Gait: Gait normal.  Psychiatric:        Mood and Affect: Mood and affect and mood normal.        Behavior: Behavior normal.      UC Treatments / Results  Labs (all labs ordered are listed, but only abnormal results are displayed) Labs Reviewed - No data to display  EKG   Radiology No results found.  Procedures Procedures (including critical care time)  Medications Ordered in UC Medications - No data to display  Initial Impression / Assessment and Plan / UC Course  I have reviewed the triage vital signs and the nursing notes.  Pertinent labs & imaging results that were available during my care of the patient were reviewed by me and considered in my medical decision making (see chart for details).   Viral sinusitis, nasal congestion, COVID-19. Discussed CDC guidelines for retesting and quarantine. Treating sinusitis with prednisone taper. Instructed patient to follow-up with her PCP if her symptoms are not improving. She agrees to plan of care.   Final Clinical Impressions(s) / UC Diagnoses   Final diagnoses:  Nasal congestion  Viral sinusitis  COVID-19     Discharge Instructions     Take the prednisone as directed.    Follow up with your primary care provider if your symptoms are not improving.        ED Prescriptions    Medication Sig Dispense Auth. Provider   predniSONE (STERAPRED UNI-PAK 21 TAB)  10 MG (21) TBPK tablet Take by mouth daily. As directed 21 tablet Sharion Balloon, NP     PDMP not reviewed this encounter.   Sharion Balloon, NP 11/12/20 [phone]

## 2020-11-12 NOTE — Discharge Instructions (Signed)
Take the prednisone as directed.  Follow up with your primary care provider if your symptoms are not improving.    

## 2020-11-21 ENCOUNTER — Ambulatory Visit (HOSPITAL_COMMUNITY): Payer: Medicare Other

## 2020-12-05 ENCOUNTER — Ambulatory Visit: Payer: Medicare Other | Admitting: Gastroenterology

## 2021-02-11 ENCOUNTER — Ambulatory Visit (INDEPENDENT_AMBULATORY_CARE_PROVIDER_SITE_OTHER): Payer: Medicare Other

## 2021-02-11 ENCOUNTER — Other Ambulatory Visit: Payer: Self-pay

## 2021-02-11 ENCOUNTER — Encounter (HOSPITAL_COMMUNITY): Payer: Self-pay | Admitting: Medical Oncology

## 2021-02-11 ENCOUNTER — Ambulatory Visit (HOSPITAL_COMMUNITY)
Admission: EM | Admit: 2021-02-11 | Discharge: 2021-02-11 | Disposition: A | Discharge status: Home / Self Care | Payer: Medicare Other | Attending: Medical Oncology | Admitting: Medical Oncology

## 2021-02-11 DIAGNOSIS — Z881 Allergy status to other antibiotic agents status: Secondary | ICD-10-CM | POA: Diagnosis not present

## 2021-02-11 DIAGNOSIS — Z791 Long term (current) use of non-steroidal anti-inflammatories (NSAID): Secondary | ICD-10-CM | POA: Diagnosis not present

## 2021-02-11 DIAGNOSIS — R059 Cough, unspecified: Secondary | ICD-10-CM | POA: Insufficient documentation

## 2021-02-11 DIAGNOSIS — Z20822 Contact with and (suspected) exposure to covid-19: Secondary | ICD-10-CM | POA: Insufficient documentation

## 2021-02-11 DIAGNOSIS — Z7952 Long term (current) use of systemic steroids: Secondary | ICD-10-CM | POA: Insufficient documentation

## 2021-02-11 DIAGNOSIS — R509 Fever, unspecified: Secondary | ICD-10-CM

## 2021-02-11 MED ORDER — FLUTICASONE PROPIONATE 50 MCG/ACT NA SUSP: 2.0000 | Freq: Every day | NASAL | 0 refills | Status: DC

## 2021-02-11 MED ORDER — ALBUTEROL SULFATE HFA 108 (90 BASE) MCG/ACT IN AERS: 2.0000 | INHALATION_SPRAY | Freq: Four times a day (QID) | RESPIRATORY_TRACT | 0 refills | Status: AC | PRN

## 2021-02-11 NOTE — ED Provider Notes (Signed)
[facility]    CSN: [id number] Arrival date & time: 02/11/21  1421      History   Chief Complaint Chief Complaint  Patient presents with  . Cough    HPI Sarah Spencer is a 65 y.o. female.   HPI  Cough: Patient states that since Wednesday she has had a dry cough.  She feels that she needs to cough up items but it has been dry since.  She notes that she has a few contacts that were coughing around her at church but has no known sick contacts with specific known illnesses.  She has been taking Flonase and Zyrtec for symptoms without much improvement.  No chest pain, shortness of breath, suspects fever over the past 2 days but has no known fever.    Past Medical History:  Diagnosis Date  . Allergies   . Anxiety   . Arthritis   . Depression   . GERD (gastroesophageal reflux disease)   . Hyperlipidemia   . Obesity   . Osteoporosis   . Vitamin D deficiency     There are no problems to display for this patient.   Past Surgical History:  Procedure Laterality Date  . CERVICAL DISC SURGERY    . CESAREAN SECTION     x 1  . COLONOSCOPY    . ESOPHAGOGASTRODUODENOSCOPY    . laparascopy    . laporoscopy    . TONSILLECTOMY      OB History   No obstetric history on file.      Home Medications    Prior to Admission medications   Medication Sig Start Date End Date Taking? Authorizing Provider  buPROPion (WELLBUTRIN SR) 150 MG 12 hr tablet bupropion HCl SR 150 mg tablet,12 hr sustained-release   Yes [provider]  Cholecalciferol (VITAMIN D3) 50 MCG (2000 UT) TABS Take 1 tablet by mouth daily.   Yes [provider]  clonazePAM (KLONOPIN) 0.5 MG tablet Take 0.5 mg by mouth 3 (three) times daily as needed. 07/26/20  Yes [provider]  Coenzyme Q10 (COQ10) 100 MG CAPS Take 1 capsule by mouth daily.   Yes [provider]  diclofenac (VOLTAREN) 75 MG EC tablet Take 75 mg by mouth daily. 07/06/20  Yes [provider]   Garlic 2774 MG CAPS Take 2 capsules by mouth at bedtime.   Yes [provider]  hyoscyamine (LEVSIN SL) 0.125 MG SL tablet Place 1 tablet (0.125 mg total) under the tongue every 4 (four) hours as needed (Abdominal pain and spasms). 09/14/20  Yes Esterwood, Amy S, PA-C  montelukast (SINGULAIR) 10 MG tablet Take 10 mg by mouth daily. 08/15/20  Yes [provider]  OVER THE COUNTER MEDICATION Cinnamon 1062m plus chromium 4086m- 2 daily   Yes [provider]  Zinc 50 MG TABS Take 1 tablet by mouth daily.   Yes [provider]  albuterol (VENTOLIN HFA) 108 (90 Base) MCG/ACT inhaler Inhale 2 puffs into the lungs as needed. Patient not taking: No sig reported    [provider]  citalopram (CELEXA) 20 MG tablet citalopram 20 mg tablet    [provider]  PEG-KCl-NaCl-NaSulf-Na Asc-C (PLENVU) 140 g SOLR Take 1 kit by mouth as directed. 09/14/20   Esterwood, Amy S, PA-C  predniSONE (STERAPRED UNI-PAK 21 TAB) 10 MG (21) TBPK tablet Take by mouth daily. As directed 11/12/20   TaSharion BalloonNP  simvastatin (ZOCOR) 10 MG tablet Take 10 mg by mouth at bedtime.  07/06/20   [provider]    Family History Family History  Problem Relation Age of Onset  . Heart disease Father   . Heart disease Sister   . Other Brother        step brother  . Heart disease Sister   . Stroke Sister        caused by medicine  . Colon cancer Neg Hx   . Esophageal cancer Neg Hx   . Rectal cancer Neg Hx     Social History Social History   Tobacco Use  . Smoking status: Never Smoker  . Smokeless tobacco: Never Used  Vaping Use  . Vaping Use: Never used  Substance Use Topics  . Alcohol use: No  . Drug use: No     Allergies   Cefdinir   Review of Systems Review of Systems  As stated above in HPI Physical Exam Triage Vital Signs ED Triage Vitals  Enc Vitals Group     BP 02/11/21 1531 (!) 156/80     Pulse Rate 02/11/21 1531 100     Resp  02/11/21 1531 20     Temp 02/11/21 1531 100.2 F (37.9 C)     Temp src --      SpO2 02/11/21 1531 93 %     Weight --      Height --      Head Circumference --      Peak Flow --      Pain Score 02/11/21 1528 0     Pain Loc --      Pain Edu? --      Excl. in [place]? --    No data found.  Updated Vital Signs BP (!) 156/80 (BP Location: Right Arm)   Pulse 100   Temp 100.2 F (37.9 C)   Resp 20   SpO2 93%   Physical Exam Vitals and nursing note reviewed.  Constitutional:      General: She is not in acute distress.    Appearance: Normal appearance. She is ill-appearing. She is not toxic-appearing or diaphoretic.  HENT:     Head: Normocephalic and atraumatic.     Right Ear: Tympanic membrane, ear canal and external ear normal.     Left Ear: Tympanic membrane, ear canal and external ear normal.     Nose: Nose normal.     Mouth/Throat:     Mouth: Mucous membranes are dry.  Eyes:     Extraocular Movements: Extraocular movements intact.     Pupils: Pupils are equal, round, and reactive to light.  Cardiovascular:     Rate and Rhythm: Normal rate and regular rhythm.     Heart sounds: Normal heart sounds.  Pulmonary:     Effort: Pulmonary effort is normal.     Breath sounds: Normal breath sounds.  Musculoskeletal:     Cervical back: Normal range of motion and neck supple.  Lymphadenopathy:     Cervical: No cervical adenopathy.  Skin:    General: Skin is warm.     Comments: NO evidence of cyanosis   Neurological:     Mental Status: She is alert and oriented to person, place, and time.  Psychiatric:        Mood and Affect: Mood normal.        Behavior: Behavior normal.      UC Treatments / Results  Labs (all labs ordered are listed, but only abnormal results are displayed) Labs Reviewed  SARS CORONAVIRUS 2 (TAT 6-24 HRS)  EKG   Radiology DG Chest 2 View  Result Date: 02/11/2021 CLINICAL DATA:  Cough for 5 days EXAM: CHEST - 2 VIEW COMPARISON:  Chest x-ray dated  07/19/2014. FINDINGS: Heart size and mediastinal contours are stable. Coarse lung markings bilaterally suggests chronic interstitial lung disease and/or chronic bronchitic change. No confluent opacity to suggest a superimposed pneumonia. No pleural effusion or pneumothorax is seen. No acute appearing osseous abnormality. IMPRESSION: 1. No active cardiopulmonary disease. No evidence of pneumonia or pulmonary edema. 2. Probable chronic interstitial lung disease and/or chronic bronchitic change. Electronically Signed   By: Franki Cabot M.D.   On: 02/11/2021 16:02    Procedures Procedures (including critical care time)  Medications Ordered in UC Medications - No data to display  Initial Impression / Assessment and Plan / UC Course  I have reviewed the triage vital signs and the nursing notes.  Pertinent labs & imaging results that were available during my care of the patient were reviewed by me and considered in my medical decision making (see chart for details).     New.  Chest x-ray and COVID testing pending. Will treat with tessalon in the meantime.  Close COVID precautions and O2 monitoring recommended.  Discussed red flag signs and symptoms. Final Clinical Impressions(s) / UC Diagnoses   Final diagnoses:  None   Discharge Instructions   None    ED Prescriptions    None     PDMP not reviewed this encounter.   Hughie Closs, [place] 02/11/21 1619

## 2021-02-11 NOTE — ED Triage Notes (Signed)
Pt reports cough started on Wed. Night . P thas tried OTC with out relief.

## 2021-02-12 ENCOUNTER — Telehealth (HOSPITAL_COMMUNITY): Payer: Self-pay | Admitting: Emergency Medicine

## 2021-02-12 LAB — SARS CORONAVIRUS 2 (TAT 6-24 HRS): SARS Coronavirus 2: NEGATIVE

## 2021-02-15 ENCOUNTER — Other Ambulatory Visit: Payer: Self-pay | Admitting: Family Medicine

## 2021-02-15 ENCOUNTER — Ambulatory Visit
Admission: RE | Admit: 2021-02-15 | Discharge: 2021-02-15 | Disposition: A | Discharge status: Home / Self Care | Payer: Medicare Other | Source: Ambulatory Visit | Attending: Family Medicine | Admitting: Family Medicine

## 2021-02-15 DIAGNOSIS — R053 Chronic cough: Secondary | ICD-10-CM

## 2021-02-15 DIAGNOSIS — R062 Wheezing: Secondary | ICD-10-CM

## 2021-04-09 ENCOUNTER — Other Ambulatory Visit: Payer: Self-pay | Admitting: Family Medicine

## 2021-04-09 ENCOUNTER — Ambulatory Visit
Admission: RE | Admit: 2021-04-09 | Discharge: 2021-04-09 | Disposition: A | Discharge status: Home / Self Care | Payer: Medicare Other | Source: Ambulatory Visit | Attending: Family Medicine | Admitting: Family Medicine

## 2021-04-09 DIAGNOSIS — M25561 Pain in right knee: Secondary | ICD-10-CM

## 2021-04-18 ENCOUNTER — Other Ambulatory Visit
Admission: RE | Admit: 2021-04-18 | Discharge: 2021-04-18 | Disposition: A | Discharge status: Home / Self Care | Payer: Medicare Other | Source: Ambulatory Visit | Attending: Pulmonary Disease | Admitting: Pulmonary Disease

## 2021-04-18 DIAGNOSIS — J4541 Moderate persistent asthma with (acute) exacerbation: Secondary | ICD-10-CM | POA: Insufficient documentation

## 2021-04-18 LAB — D-DIMER, QUANTITATIVE: D-Dimer, Quant: 0.72 ug/mL-FEU — ABNORMAL HIGH (ref 0.00–0.50)

## 2021-05-16 ENCOUNTER — Institutional Professional Consult (permissible substitution): Payer: Medicare Other | Admitting: Pulmonary Disease

## 2021-06-13 ENCOUNTER — Other Ambulatory Visit: Payer: Self-pay | Admitting: Pulmonary Disease

## 2021-06-13 DIAGNOSIS — R0609 Other forms of dyspnea: Secondary | ICD-10-CM

## 2021-06-13 DIAGNOSIS — R7989 Other specified abnormal findings of blood chemistry: Secondary | ICD-10-CM

## 2021-06-13 DIAGNOSIS — R0600 Dyspnea, unspecified: Secondary | ICD-10-CM

## 2021-06-14 ENCOUNTER — Other Ambulatory Visit: Payer: Self-pay

## 2021-06-14 ENCOUNTER — Ambulatory Visit
Admission: RE | Admit: 2021-06-14 | Discharge: 2021-06-14 | Disposition: A | Discharge status: Home / Self Care | Payer: Medicare Other | Source: Ambulatory Visit | Attending: Pulmonary Disease | Admitting: Pulmonary Disease

## 2021-06-14 DIAGNOSIS — R0600 Dyspnea, unspecified: Secondary | ICD-10-CM | POA: Insufficient documentation

## 2021-06-14 DIAGNOSIS — R7989 Other specified abnormal findings of blood chemistry: Secondary | ICD-10-CM | POA: Insufficient documentation

## 2021-06-14 DIAGNOSIS — R0609 Other forms of dyspnea: Secondary | ICD-10-CM

## 2021-06-14 LAB — POCT I-STAT CREATININE: Creatinine, Ser: 0.8 mg/dL (ref 0.44–1.00)

## 2021-06-14 MED ORDER — IOHEXOL 350 MG/ML SOLN
75.0000 mL | Freq: Once | INTRAVENOUS | Status: AC | PRN
  Administered 2021-06-14: 75 mL via INTRAVENOUS

## 2021-07-16 ENCOUNTER — Other Ambulatory Visit: Payer: Self-pay | Admitting: Orthopedic Surgery

## 2021-07-16 DIAGNOSIS — R29898 Other symptoms and signs involving the musculoskeletal system: Secondary | ICD-10-CM

## 2021-07-26 ENCOUNTER — Ambulatory Visit
Admission: RE | Admit: 2021-07-26 | Discharge: 2021-07-26 | Disposition: A | Discharge status: Home / Self Care | Payer: Medicare Other | Source: Ambulatory Visit | Attending: Orthopedic Surgery | Admitting: Orthopedic Surgery

## 2021-07-26 DIAGNOSIS — R29898 Other symptoms and signs involving the musculoskeletal system: Secondary | ICD-10-CM

## 2021-08-23 ENCOUNTER — Other Ambulatory Visit: Payer: Self-pay | Admitting: Neurological Surgery

## 2021-09-17 NOTE — Pre-Procedure Instructions (Signed)
Surgical Instructions    Your procedure is scheduled on Friday, December 9th.  Report to [facility] Main Entrance "A" at 9:00 A.M., then check in with the Admitting office.  Call this number if you have problems the morning of surgery:  [phone]   If you have any questions prior to your surgery date call [phone]: Open Monday-Friday 8am-4pm    Remember:  Do not eat or drink after midnight the night before your surgery   Take these medicines the morning of surgery with A SIP OF WATER  citalopram (CELEXA) famotidine (PEPCID)  As needed: clonazePAM (KLONOPIN)  Albuterol inhaler-please bring this to the hospital.  Eye drops  As of today, STOP taking any Aspirin (unless otherwise instructed by your surgeon) Aleve, Naproxen, Ibuprofen, Motrin, Advil, Goody's, BC's, all herbal medications, fish oil, and all vitamins. This includes: diclofenac (VOLTAREN)                      Do NOT Smoke (Tobacco/Vaping) or drink Alcohol 24 hours prior to your procedure.  If you use a CPAP at night, you may bring all equipment for your overnight stay.   Contacts, glasses, piercing's, hearing aid's, dentures or partials may not be worn into surgery, please bring cases for these belongings.    For patients admitted to the hospital, discharge time will be determined by your treatment team.   Patients discharged the day of surgery will not be allowed to drive home, and someone needs to stay with them for 24 hours.  NO VISITORS WILL BE ALLOWED IN PRE-OP WHERE PATIENTS GET READY FOR SURGERY.  ONLY 1 SUPPORT PERSON MAY BE PRESENT IN THE WAITING ROOM WHILE YOU ARE IN SURGERY.  IF YOU ARE TO BE ADMITTED, ONCE YOU ARE IN YOUR ROOM YOU WILL BE ALLOWED TWO (2) VISITORS.  Minor children may have two parents present. Special consideration for safety and communication needs will be reviewed on a case by case basis.   Special instructions:   [facility]- Preparing For Surgery  Before surgery, you can play  an important role. Because skin is not sterile, your skin needs to be as free of germs as possible. You can reduce the number of germs on your skin by washing with CHG (chlorahexidine gluconate) Soap before surgery.  CHG is an antiseptic cleaner which kills germs and bonds with the skin to continue killing germs even after washing.    Oral Hygiene is also important to reduce your risk of infection.  Remember - BRUSH YOUR TEETH THE MORNING OF SURGERY WITH YOUR REGULAR TOOTHPASTE  Please do not use if you have an allergy to CHG or antibacterial soaps. If your skin becomes reddened/irritated stop using the CHG.  Do not shave (including legs and underarms) for at least 48 hours prior to first CHG shower. It is OK to shave your face.  Please follow these instructions carefully.   Shower the NIGHT BEFORE SURGERY and the MORNING OF SURGERY  If you chose to wash your hair, wash your hair first as usual with your normal shampoo.  After you shampoo, rinse your hair and body thoroughly to remove the shampoo.  Use CHG Soap as you would any other liquid soap. You can apply CHG directly to the skin and wash gently with a scrungie or a clean washcloth.   Apply the CHG Soap to your body ONLY FROM THE NECK DOWN.  Do not use on open wounds or open sores. Avoid contact with your eyes,  ears, mouth and genitals (private parts). Wash Face and genitals (private parts)  with your normal soap.   Wash thoroughly, paying special attention to the area where your surgery will be performed.  Thoroughly rinse your body with warm water from the neck down.  DO NOT shower/wash with your normal soap after using and rinsing off the CHG Soap.  Pat yourself dry with a CLEAN TOWEL.  Wear CLEAN PAJAMAS to bed the night before surgery  Place CLEAN SHEETS on your bed the night before your surgery  DO NOT SLEEP WITH PETS.   Day of Surgery: Shower with CHG soap. Do not wear jewelry, make up, nail polish, gel polish,  artificial nails, or any other type of covering on natural nails including finger and toenails. If patients have artificial nails, gel coating, etc. that need to be removed by a nail salon please have this removed prior to surgery. Surgery may need to be canceled/delayed if the surgeon/ anesthesia feels like the patient is unable to be adequately monitored. Do not wear lotions, powders, perfumes, or deodorant. Do not shave 48 hours prior to surgery.  Do not bring valuables to the hospital. [facility] is not responsible for any belongings or valuables. Wear Clean/Comfortable clothing the morning of surgery Remember to brush your teeth WITH YOUR REGULAR TOOTHPASTE.   Please read over the following fact sheets that you were given.   3 days prior to your procedure or After your COVID test   You are not required to quarantine however you are required to wear a well-fitting mask when you are out and around people not in your household. If your mask becomes wet or soiled, replace with a new one.   Wash your hands often with soap and water for 20 seconds or clean your hands with an alcohol-based hand sanitizer that contains at least 60% alcohol.   Do not share personal items.   Notify your provider:  o if you are in close contact with someone who has COVID  o or if you develop a fever of 100.4 or greater, sneezing, cough, sore throat, shortness of breath or body aches.

## 2021-09-18 ENCOUNTER — Other Ambulatory Visit: Payer: Self-pay

## 2021-09-18 ENCOUNTER — Encounter (HOSPITAL_COMMUNITY): Payer: Self-pay

## 2021-09-18 ENCOUNTER — Encounter (HOSPITAL_COMMUNITY)
Admission: RE | Admit: 2021-09-18 | Discharge: 2021-09-18 | Disposition: A | Discharge status: Home / Self Care | Payer: Medicare Other | Source: Ambulatory Visit | Attending: Neurological Surgery | Admitting: Neurological Surgery

## 2021-09-18 VITALS — BP 132/82 | HR 101 | Temp 98.3°F | Resp 18 | Ht 65.0 in | Wt 237.1 lb

## 2021-09-18 DIAGNOSIS — Z01812 Encounter for preprocedural laboratory examination: Secondary | ICD-10-CM | POA: Insufficient documentation

## 2021-09-18 DIAGNOSIS — J45998 Other asthma: Secondary | ICD-10-CM | POA: Insufficient documentation

## 2021-09-18 DIAGNOSIS — Z01818 Encounter for other preprocedural examination: Secondary | ICD-10-CM

## 2021-09-18 DIAGNOSIS — R609 Edema, unspecified: Secondary | ICD-10-CM | POA: Insufficient documentation

## 2021-09-18 DIAGNOSIS — R0609 Other forms of dyspnea: Secondary | ICD-10-CM | POA: Insufficient documentation

## 2021-09-18 DIAGNOSIS — Z79899 Other long term (current) drug therapy: Secondary | ICD-10-CM | POA: Diagnosis not present

## 2021-09-18 DIAGNOSIS — Z20822 Contact with and (suspected) exposure to covid-19: Secondary | ICD-10-CM | POA: Diagnosis not present

## 2021-09-18 DIAGNOSIS — I2510 Atherosclerotic heart disease of native coronary artery without angina pectoris: Secondary | ICD-10-CM | POA: Insufficient documentation

## 2021-09-18 DIAGNOSIS — E785 Hyperlipidemia, unspecified: Secondary | ICD-10-CM | POA: Diagnosis not present

## 2021-09-18 HISTORY — DX: Family history of other specified conditions: Z84.89

## 2021-09-18 HISTORY — DX: Other specified postprocedural states: R11.2

## 2021-09-18 HISTORY — DX: Other specified postprocedural states: Z98.890

## 2021-09-18 HISTORY — DX: Bronchitis, not specified as acute or chronic: J40

## 2021-09-18 LAB — BASIC METABOLIC PANEL
Anion gap: 7 (ref 5–15)
BUN: 13 mg/dL (ref 8–23)
CO2: 29 mmol/L (ref 22–32)
Calcium: 9.3 mg/dL (ref 8.9–10.3)
Chloride: 102 mmol/L (ref 98–111)
Creatinine, Ser: 0.87 mg/dL (ref 0.44–1.00)
GFR, Estimated: >60 mL/min (ref >60)
Glucose, Bld: 99 mg/dL (ref 70–99)
Potassium: 4.3 mmol/L (ref 3.5–5.1)
Sodium: 138 mmol/L (ref 135–145)

## 2021-09-18 LAB — SURGICAL PCR SCREEN
MRSA, PCR: NEGATIVE
Staphylococcus aureus: NEGATIVE

## 2021-09-18 LAB — PROTIME-INR
INR: 1 (ref 0.8–1.2)
Prothrombin Time: 13 seconds (ref 11.4–15.2)

## 2021-09-18 LAB — CBC
HCT: 42.7 % (ref 36.0–46.0)
Hemoglobin: 13.7 g/dL (ref 12.0–15.0)
MCH: 28.7 pg (ref 26.0–34.0)
MCHC: 32.1 g/dL (ref 30.0–36.0)
MCV: 89.5 fL (ref 80.0–100.0)
Platelets: 346 10*3/uL (ref 150–400)
RBC: 4.77 MIL/uL (ref 3.87–5.11)
RDW: 13.9 % (ref 11.5–15.5)
WBC: 6.4 10*3/uL (ref 4.0–10.5)
nRBC: 0 % (ref 0.0–0.2)

## 2021-09-18 LAB — TYPE AND SCREEN
ABO/RH(D): A POS
Antibody Screen: NEGATIVE

## 2021-09-18 LAB — SARS CORONAVIRUS 2 (TAT 6-24 HRS): SARS Coronavirus 2: NEGATIVE

## 2021-09-18 NOTE — Progress Notes (Signed)
   09/18/21 1012  OBSTRUCTIVE SLEEP APNEA  Have you ever been diagnosed with sleep apnea through a sleep study? No  Do you snore loudly (loud enough to be heard through closed doors)?  1  Do you often feel tired, fatigued, or sleepy during the daytime (such as falling asleep during driving or talking to someone)? 0  Has anyone observed you stop breathing during your sleep? 1  Do you have, or are you being treated for high blood pressure? 0  BMI more than 35 kg/m2? 1  Age > 50 (1-yes) 1  Neck circumference greater than:Female 16 inches or larger, Female 17inches or larger? 1  Female Gender (Yes=1) 0  Obstructive Sleep Apnea Score 5  Score 5 or greater  Results sent to PCP

## 2021-09-18 NOTE — Progress Notes (Signed)
PCP - Dr. Clelia Croft at [facility] Pulm-Dr. [facility] Cardiologist - Dr. Marcina Millard  PPM/ICD - n/a  Chest x-ray - 02/16/21 EKG - 07/12/21, requested from [facility] Stress Test - denies ECHO - 07/19/21-[facility] Cardiac Cath - denies  Sleep Study - denies. Stop bang positive. Routed to PCP CPAP - denies  Blood Thinner Instructions: n/a Aspirin Instructions: n/a  NPO at midnight  COVID TEST- 09/18/21; done in PAT  Anesthesia review: Yes, pending EKG tracing.   Patient denies shortness of breath, fever, cough and chest pain at PAT appointment   All instructions explained to the patient, with a verbal understanding of the material. Patient agrees to go over the instructions while at home for a better understanding. Patient also instructed to self quarantine after being tested for COVID-19. The opportunity to ask questions was provided.

## 2021-09-19 NOTE — Progress Notes (Signed)
Anesthesia Chart Review:  Recently evaluated by cardiology at [facility] clinic for report of DOE.  Last seen by Dr. Darrold Junker 08/02/2021.  Per note, "65 year old female with history of recurrent bronchitis, followed by Dr. Karna Christmas for reactive airway disease, referred for evaluation of exertional dyspnea. EKG is borderline abnormal. Patient reports intermittent mild pedal edema. Patient has hyperlipidemia, on simvastatin. 2D echocardiogram reveals normal left ventricular function, without evidence for wall motion abnormalities, with mild mitral regurgitation."  No further cardiac work-up was recommended at that time.  Clearance states she is low risk.  Copy on chart.  No diagnosis of OSA however STOP-BANG score of 5 indicates moderate risk.  Preop labs reviewed, unremarkable.  EKG 07/12/2021 (Care Everywhere, tracing requested): NSR.  Rate 81.  Possible LAE.  RSR' pattern in V1 suggests right ventricular conduction delay.  Inferior infarct, age undetermined.  CHEST - 2 VIEW 02/15/21: COMPARISON:  02/11/2021   FINDINGS: The heart size and mediastinal contours are within normal limits. Both lungs are clear. The visualized skeletal structures are unremarkable.   IMPRESSION: No active cardiopulmonary disease.   TTE 07/19/21 (care everywhere): INTERPRETATION  NORMAL LEFT VENTRICULAR SYSTOLIC FUNCTION  NORMAL RIGHT VENTRICULAR SYSTOLIC FUNCTION  MILD VALVULAR REGURGITATION (See above)  NO VALVULAR STENOSIS    Zannie Cove [facility]/Anesthesiology Phone [phone] 09/19/2021 11:45 AM

## 2021-09-19 NOTE — Anesthesia Preprocedure Evaluation (Addendum)
Anesthesia Evaluation  Patient identified by MRN, date of birth, ID band Patient awake    Reviewed: Allergy & Precautions, NPO status , Patient's Chart, lab work & pertinent test results  History of Anesthesia Complications (+) PONV and history of anesthetic complications  Airway Mallampati: IV  TM Distance: <3 FB Neck ROM: Full    Dental  (+) Teeth Intact, Dental Advisory Given   Pulmonary neg shortness of breath, neg sleep apnea, neg COPD, neg recent URI,    breath sounds clear to auscultation       Cardiovascular negative cardio ROS   Rhythm:Regular     Neuro/Psych PSYCHIATRIC DISORDERS Anxiety Depression Spondylolisthesis    GI/Hepatic Neg liver ROS, GERD  Medicated and Controlled,  Endo/Other  negative endocrine ROS  Renal/GU negative Renal ROS     Musculoskeletal  (+) Arthritis ,   Abdominal   Peds  Hematology negative hematology ROS (+) Lab Results      Component                Value               Date                      WBC                      6.4                 09/18/2021                HGB                      13.7                09/18/2021                HCT                      42.7                09/18/2021                MCV                      89.5                09/18/2021                PLT                      346                 09/18/2021              Anesthesia Other Findings   Reproductive/Obstetrics                             Anesthesia Physical Anesthesia Plan  ASA: 2  Anesthesia Plan: General   Post-op Pain Management: Gabapentin PO (pre-op) and Tylenol PO (pre-op)   Induction: Intravenous  PONV Risk Score and Plan: 4 or greater and Ondansetron, Propofol infusion, TIVA and Dexamethasone  Airway Management Planned: Oral ETT and Video Laryngoscope Planned  Additional Equipment: None  Intra-op Plan:   Post-operative Plan: Extubation in  OR  Informed Consent: I have reviewed the patients History and Physical,  chart, labs and discussed the procedure including the risks, benefits and alternatives for the proposed anesthesia with the patient or authorized representative who has indicated his/her understanding and acceptance.     Dental advisory given  Plan Discussed with: CRNA and Anesthesiologist  Anesthesia Plan Comments: (PAT note by Antionette Poles, PA-C: Recently evaluated by cardiology at [facility] clinic for report of DOE.  Last seen by Dr. Darrold Junker 08/02/2021.  Per note, "65 year old female with history of recurrent bronchitis, followed by Dr. Karna Christmas for reactive airway disease, referred for evaluation of exertional dyspnea. EKG is borderline abnormal. Patient reports intermittent mild pedal edema. Patient has hyperlipidemia, on simvastatin. 2D echocardiogram reveals normal left ventricular function, without evidence for wall motion abnormalities, with mild mitral regurgitation."  No further cardiac work-up was recommended at that time.  Clearance states she is low risk.  Copy on chart.  No diagnosis of OSA however STOP-BANG score of 5 indicates moderate risk.  Preop labs reviewed, unremarkable.  EKG 07/12/2021 (Care Everywhere, tracing requested): NSR.  Rate 81.  Possible LAE.  RSR' pattern in V1 suggests right ventricular conduction delay.  Inferior infarct, age undetermined.  CHEST - 2 VIEW 02/15/21: COMPARISON: 02/11/2021  FINDINGS: The heart size and mediastinal contours are within normal limits. Both lungs are clear. The visualized skeletal structures are unremarkable.  IMPRESSION: No active cardiopulmonary disease.  TTE 07/19/21 (care everywhere): INTERPRETATION  NORMAL LEFT VENTRICULAR SYSTOLIC FUNCTION  NORMAL RIGHT VENTRICULAR SYSTOLIC FUNCTION  MILD VALVULAR REGURGITATION (See above)  NO VALVULAR STENOSIS  )       Anesthesia Quick Evaluation

## 2021-09-20 MED ORDER — VANCOMYCIN HCL 1500 MG/300ML IV SOLN
1500.0000 mg | INTRAVENOUS | Status: AC
  Administered 2021-09-21: 1500 mg via INTRAVENOUS
  Filled 2021-09-20: qty 300

## 2021-09-21 ENCOUNTER — Inpatient Hospital Stay (HOSPITAL_COMMUNITY): Payer: Medicare Other

## 2021-09-21 ENCOUNTER — Inpatient Hospital Stay (HOSPITAL_COMMUNITY): Payer: Medicare Other | Admitting: Physician Assistant

## 2021-09-21 ENCOUNTER — Encounter (HOSPITAL_COMMUNITY): Payer: Self-pay | Admitting: Neurological Surgery

## 2021-09-21 ENCOUNTER — Inpatient Hospital Stay (HOSPITAL_COMMUNITY): Payer: Medicare Other | Admitting: Anesthesiology

## 2021-09-21 ENCOUNTER — Observation Stay (HOSPITAL_COMMUNITY)
Admission: RE | Admit: 2021-09-21 | Discharge: 2021-09-22 | Disposition: A | Discharge status: Home / Self Care | Payer: Medicare Other | Attending: Neurological Surgery | Admitting: Neurological Surgery

## 2021-09-21 ENCOUNTER — Other Ambulatory Visit: Payer: Self-pay

## 2021-09-21 ENCOUNTER — Encounter (HOSPITAL_COMMUNITY)
Admission: RE | Disposition: A | Discharge status: Home / Self Care | Payer: Self-pay | Source: Home / Self Care | Attending: Neurological Surgery

## 2021-09-21 DIAGNOSIS — Z419 Encounter for procedure for purposes other than remedying health state, unspecified: Secondary | ICD-10-CM

## 2021-09-21 DIAGNOSIS — Z9889 Other specified postprocedural states: Secondary | ICD-10-CM

## 2021-09-21 DIAGNOSIS — M48062 Spinal stenosis, lumbar region with neurogenic claudication: Principal | ICD-10-CM | POA: Insufficient documentation

## 2021-09-21 DIAGNOSIS — Z79899 Other long term (current) drug therapy: Secondary | ICD-10-CM | POA: Diagnosis not present

## 2021-09-21 DIAGNOSIS — M4316 Spondylolisthesis, lumbar region: Secondary | ICD-10-CM | POA: Diagnosis not present

## 2021-09-21 HISTORY — PX: LAMINECTOMY WITH POSTERIOR LATERAL ARTHRODESIS LEVEL 3: SHX6337

## 2021-09-21 LAB — ABO/RH: ABO/RH(D): A POS

## 2021-09-21 SURGERY — LAMINECTOMY WITH POSTERIOR LATERAL ARTHRODESIS LEVEL 3
Anesthesia: General | Site: Spine Lumbar | Laterality: Bilateral

## 2021-09-21 MED ORDER — LIDOCAINE 2% (20 MG/ML) 5 ML SYRINGE
INTRAMUSCULAR | Status: DC | PRN
  Administered 2021-09-21: 60 mg via INTRAVENOUS

## 2021-09-21 MED ORDER — ACETAMINOPHEN 650 MG RE SUPP: 650.0000 mg | RECTAL | Status: DC | PRN

## 2021-09-21 MED ORDER — ALBUTEROL SULFATE (2.5 MG/3ML) 0.083% IN NEBU: 2.5000 mg | INHALATION_SOLUTION | Freq: Four times a day (QID) | RESPIRATORY_TRACT | Status: DC | PRN

## 2021-09-21 MED ORDER — DEXAMETHASONE 4 MG PO TABS
4.0000 mg | ORAL_TABLET | Freq: Four times a day (QID) | ORAL | Status: DC
  Administered 2021-09-21 – 2021-09-22 (×3): 4 mg via ORAL
  Filled 2021-09-21 (×3): qty 1

## 2021-09-21 MED ORDER — MONTELUKAST SODIUM 10 MG PO TABS
10.0000 mg | ORAL_TABLET | Freq: Every day | ORAL | Status: DC
  Administered 2021-09-21: 10 mg via ORAL
  Filled 2021-09-21: qty 1

## 2021-09-21 MED ORDER — FAMOTIDINE 20 MG PO TABS
20.0000 mg | ORAL_TABLET | Freq: Two times a day (BID) | ORAL | Status: DC
  Administered 2021-09-21: 20 mg via ORAL
  Filled 2021-09-21: qty 1

## 2021-09-21 MED ORDER — FENTANYL CITRATE (PF) 250 MCG/5ML IJ SOLN
INTRAMUSCULAR | Status: DC | PRN
  Administered 2021-09-21: 150 ug via INTRAVENOUS

## 2021-09-21 MED ORDER — THROMBIN 5000 UNITS EX SOLR: OROMUCOSAL | Status: DC | PRN

## 2021-09-21 MED ORDER — PROPOFOL 10 MG/ML IV BOLUS
INTRAVENOUS | Status: DC | PRN
  Administered 2021-09-21: 140 mg via INTRAVENOUS

## 2021-09-21 MED ORDER — MIDAZOLAM HCL 5 MG/5ML IJ SOLN
INTRAMUSCULAR | Status: DC | PRN
  Administered 2021-09-21: 2 mg via INTRAVENOUS

## 2021-09-21 MED ORDER — POTASSIUM CHLORIDE IN NACL 20-0.9 MEQ/L-% IV SOLN: INTRAVENOUS | Status: DC

## 2021-09-21 MED ORDER — CELECOXIB 200 MG PO CAPS
200.0000 mg | ORAL_CAPSULE | Freq: Two times a day (BID) | ORAL | Status: DC
  Administered 2021-09-21: 200 mg via ORAL
  Filled 2021-09-21: qty 1

## 2021-09-21 MED ORDER — FENTANYL CITRATE (PF) 250 MCG/5ML IJ SOLN
INTRAMUSCULAR | Status: AC
  Filled 2021-09-21: qty 5

## 2021-09-21 MED ORDER — LIDOCAINE 2% (20 MG/ML) 5 ML SYRINGE
INTRAMUSCULAR | Status: AC
  Filled 2021-09-21: qty 5

## 2021-09-21 MED ORDER — 0.9 % SODIUM CHLORIDE (POUR BTL) OPTIME
TOPICAL | Status: DC | PRN
  Administered 2021-09-21: 1000 mL

## 2021-09-21 MED ORDER — LACTATED RINGERS IV SOLN: INTRAVENOUS | Status: DC

## 2021-09-21 MED ORDER — DEXAMETHASONE SODIUM PHOSPHATE 10 MG/ML IJ SOLN
INTRAMUSCULAR | Status: DC | PRN
  Administered 2021-09-21: 10 mg via INTRAVENOUS

## 2021-09-21 MED ORDER — THROMBIN 5000 UNITS EX SOLR
CUTANEOUS | Status: AC
  Filled 2021-09-21: qty 5000

## 2021-09-21 MED ORDER — CLONAZEPAM 0.5 MG PO TABS
0.2500 mg | ORAL_TABLET | Freq: Three times a day (TID) | ORAL | Status: DC | PRN
Start: 2021-09-21 — End: 2021-09-22

## 2021-09-21 MED ORDER — PHENYLEPHRINE HCL-NACL 20-0.9 MG/250ML-% IV SOLN
INTRAVENOUS | Status: DC | PRN
  Administered 2021-09-21: 40 ug/min via INTRAVENOUS

## 2021-09-21 MED ORDER — CHLORHEXIDINE GLUCONATE CLOTH 2 % EX PADS: 6.0000 | MEDICATED_PAD | Freq: Once | CUTANEOUS | Status: DC

## 2021-09-21 MED ORDER — OXYCODONE HCL 5 MG PO TABS
5.0000 mg | ORAL_TABLET | ORAL | Status: DC | PRN
Start: 2021-09-21 — End: 2021-09-22
  Administered 2021-09-21 – 2021-09-22 (×4): 10 mg via ORAL
  Filled 2021-09-21 (×4): qty 2

## 2021-09-21 MED ORDER — FENTANYL CITRATE (PF) 100 MCG/2ML IJ SOLN
25.0000 ug | INTRAMUSCULAR | Status: DC | PRN
  Administered 2021-09-21 (×2): 50 ug via INTRAVENOUS

## 2021-09-21 MED ORDER — MENTHOL 3 MG MT LOZG: 1.0000 | LOZENGE | OROMUCOSAL | Status: DC | PRN

## 2021-09-21 MED ORDER — PHENOL 1.4 % MT LIQD: 1.0000 | OROMUCOSAL | Status: DC | PRN

## 2021-09-21 MED ORDER — CHLORHEXIDINE GLUCONATE 0.12 % MT SOLN: 15.0000 mL | Freq: Once | OROMUCOSAL | Status: AC

## 2021-09-21 MED ORDER — THROMBIN 20000 UNITS EX SOLR
CUTANEOUS | Status: AC
  Filled 2021-09-21: qty 20000

## 2021-09-21 MED ORDER — MIDAZOLAM HCL 2 MG/2ML IJ SOLN
INTRAMUSCULAR | Status: AC
  Filled 2021-09-21: qty 2

## 2021-09-21 MED ORDER — ONDANSETRON HCL 4 MG/2ML IJ SOLN
INTRAMUSCULAR | Status: AC
  Filled 2021-09-21: qty 2

## 2021-09-21 MED ORDER — ONDANSETRON HCL 4 MG/2ML IJ SOLN
INTRAMUSCULAR | Status: DC | PRN
  Administered 2021-09-21: 4 mg via INTRAVENOUS

## 2021-09-21 MED ORDER — ACETAMINOPHEN 10 MG/ML IV SOLN: 1000.0000 mg | Freq: Once | INTRAVENOUS | Status: DC | PRN

## 2021-09-21 MED ORDER — CEFAZOLIN SODIUM-DEXTROSE 2-4 GM/100ML-% IV SOLN
2.0000 g | Freq: Three times a day (TID) | INTRAVENOUS | Status: AC
  Administered 2021-09-21 – 2021-09-22 (×2): 2 g via INTRAVENOUS
  Filled 2021-09-21 (×2): qty 100

## 2021-09-21 MED ORDER — SUGAMMADEX SODIUM 200 MG/2ML IV SOLN
INTRAVENOUS | Status: DC | PRN
  Administered 2021-09-21: 200 mg via INTRAVENOUS

## 2021-09-21 MED ORDER — ACETAMINOPHEN 500 MG PO TABS: 1000.0000 mg | ORAL_TABLET | Freq: Once | ORAL | Status: DC | PRN

## 2021-09-21 MED ORDER — GABAPENTIN 300 MG PO CAPS
ORAL_CAPSULE | ORAL | Status: AC
  Administered 2021-09-21: 300 mg via ORAL
  Filled 2021-09-21: qty 1

## 2021-09-21 MED ORDER — GABAPENTIN 300 MG PO CAPS: 300.0000 mg | ORAL_CAPSULE | ORAL | Status: AC

## 2021-09-21 MED ORDER — MORPHINE SULFATE (PF) 2 MG/ML IV SOLN: 2.0000 mg | INTRAVENOUS | Status: DC | PRN

## 2021-09-21 MED ORDER — ALBUTEROL SULFATE HFA 108 (90 BASE) MCG/ACT IN AERS: 2.0000 | INHALATION_SPRAY | Freq: Four times a day (QID) | RESPIRATORY_TRACT | Status: DC | PRN

## 2021-09-21 MED ORDER — DICLOFENAC SODIUM 75 MG PO TBEC
75.0000 mg | DELAYED_RELEASE_TABLET | Freq: Every day | ORAL | Status: DC
  Filled 2021-09-21: qty 1

## 2021-09-21 MED ORDER — OXYCODONE HCL 5 MG/5ML PO SOLN: 5.0000 mg | Freq: Once | ORAL | Status: DC | PRN

## 2021-09-21 MED ORDER — PHENYLEPHRINE 40 MCG/ML (10ML) SYRINGE FOR IV PUSH (FOR BLOOD PRESSURE SUPPORT)
PREFILLED_SYRINGE | INTRAVENOUS | Status: DC | PRN
  Administered 2021-09-21 (×2): 80 ug via INTRAVENOUS
  Administered 2021-09-21 (×2): 120 ug via INTRAVENOUS

## 2021-09-21 MED ORDER — FENTANYL CITRATE (PF) 100 MCG/2ML IJ SOLN
INTRAMUSCULAR | Status: AC
  Filled 2021-09-21: qty 2

## 2021-09-21 MED ORDER — BUPIVACAINE HCL (PF) 0.25 % IJ SOLN
INTRAMUSCULAR | Status: AC
  Filled 2021-09-21: qty 30

## 2021-09-21 MED ORDER — DEXAMETHASONE SODIUM PHOSPHATE 10 MG/ML IJ SOLN: 10.0000 mg | Freq: Once | INTRAMUSCULAR | Status: DC

## 2021-09-21 MED ORDER — SODIUM CHLORIDE 0.9% FLUSH: 3.0000 mL | Freq: Two times a day (BID) | INTRAVENOUS | Status: DC

## 2021-09-21 MED ORDER — ACETAMINOPHEN 500 MG PO TABS: 1000.0000 mg | ORAL_TABLET | ORAL | Status: AC

## 2021-09-21 MED ORDER — ROCURONIUM BROMIDE 10 MG/ML (PF) SYRINGE
PREFILLED_SYRINGE | INTRAVENOUS | Status: DC | PRN
  Administered 2021-09-21: 80 mg via INTRAVENOUS
  Administered 2021-09-21: 20 mg via INTRAVENOUS

## 2021-09-21 MED ORDER — METHOCARBAMOL 1000 MG/10ML IJ SOLN
500.0000 mg | Freq: Four times a day (QID) | INTRAVENOUS | Status: DC | PRN
  Filled 2021-09-21: qty 5

## 2021-09-21 MED ORDER — CITALOPRAM HYDROBROMIDE 20 MG PO TABS
20.0000 mg | ORAL_TABLET | Freq: Every day | ORAL | Status: DC
  Administered 2021-09-21: 20 mg via ORAL
  Filled 2021-09-21: qty 1

## 2021-09-21 MED ORDER — PROPOFOL 500 MG/50ML IV EMUL
INTRAVENOUS | Status: DC | PRN
  Administered 2021-09-21: 25 ug/kg/min via INTRAVENOUS

## 2021-09-21 MED ORDER — SENNA 8.6 MG PO TABS
1.0000 | ORAL_TABLET | Freq: Two times a day (BID) | ORAL | Status: DC
  Administered 2021-09-21: 8.6 mg via ORAL
  Filled 2021-09-21: qty 1

## 2021-09-21 MED ORDER — DEXAMETHASONE SODIUM PHOSPHATE 10 MG/ML IJ SOLN
INTRAMUSCULAR | Status: AC
  Filled 2021-09-21: qty 1

## 2021-09-21 MED ORDER — HYDROCODONE-ACETAMINOPHEN 7.5-325 MG PO TABS
1.0000 | ORAL_TABLET | Freq: Four times a day (QID) | ORAL | Status: DC
  Administered 2021-09-21: 1 via ORAL
  Filled 2021-09-21: qty 1

## 2021-09-21 MED ORDER — METHOCARBAMOL 500 MG PO TABS
500.0000 mg | ORAL_TABLET | Freq: Four times a day (QID) | ORAL | Status: DC | PRN
  Administered 2021-09-21 – 2021-09-22 (×3): 500 mg via ORAL
  Filled 2021-09-21 (×3): qty 1

## 2021-09-21 MED ORDER — PROPOFOL 10 MG/ML IV BOLUS
INTRAVENOUS | Status: AC
  Filled 2021-09-21: qty 20

## 2021-09-21 MED ORDER — BUPIVACAINE HCL (PF) 0.25 % IJ SOLN
INTRAMUSCULAR | Status: DC | PRN
  Administered 2021-09-21: 5 mL

## 2021-09-21 MED ORDER — ONDANSETRON HCL 4 MG/2ML IJ SOLN: 4.0000 mg | Freq: Four times a day (QID) | INTRAMUSCULAR | Status: DC | PRN

## 2021-09-21 MED ORDER — DEXAMETHASONE SODIUM PHOSPHATE 4 MG/ML IJ SOLN: 4.0000 mg | Freq: Four times a day (QID) | INTRAMUSCULAR | Status: DC

## 2021-09-21 MED ORDER — SODIUM CHLORIDE 0.9% FLUSH: 3.0000 mL | INTRAVENOUS | Status: DC | PRN

## 2021-09-21 MED ORDER — SODIUM CHLORIDE 0.9 % IV SOLN: 250.0000 mL | INTRAVENOUS | Status: DC

## 2021-09-21 MED ORDER — ACETAMINOPHEN 500 MG PO TABS
ORAL_TABLET | ORAL | Status: AC
  Administered 2021-09-21: 1000 mg via ORAL
  Filled 2021-09-21: qty 2

## 2021-09-21 MED ORDER — ACETAMINOPHEN 325 MG PO TABS
650.0000 mg | ORAL_TABLET | ORAL | Status: DC | PRN
  Administered 2021-09-21 – 2021-09-22 (×2): 650 mg via ORAL
  Filled 2021-09-21 (×2): qty 2

## 2021-09-21 MED ORDER — ONDANSETRON HCL 4 MG PO TABS: 4.0000 mg | ORAL_TABLET | Freq: Four times a day (QID) | ORAL | Status: DC | PRN

## 2021-09-21 MED ORDER — CHLORHEXIDINE GLUCONATE 0.12 % MT SOLN
OROMUCOSAL | Status: AC
  Administered 2021-09-21: 15 mL via OROMUCOSAL
  Filled 2021-09-21: qty 15

## 2021-09-21 MED ORDER — ORAL CARE MOUTH RINSE: 15.0000 mL | Freq: Once | OROMUCOSAL | Status: AC

## 2021-09-21 MED ORDER — ROCURONIUM BROMIDE 10 MG/ML (PF) SYRINGE
PREFILLED_SYRINGE | INTRAVENOUS | Status: AC
  Filled 2021-09-21: qty 10

## 2021-09-21 MED ORDER — ACETAMINOPHEN 160 MG/5ML PO SOLN: 1000.0000 mg | Freq: Once | ORAL | Status: DC | PRN

## 2021-09-21 MED ORDER — OXYCODONE HCL 5 MG PO TABS: 5.0000 mg | ORAL_TABLET | Freq: Once | ORAL | Status: DC | PRN

## 2021-09-21 MED ORDER — THROMBIN 20000 UNITS EX SOLR: CUTANEOUS | Status: DC | PRN

## 2021-09-21 MED ORDER — HYDROCODONE-ACETAMINOPHEN 5-325 MG PO TABS
1.0000 | ORAL_TABLET | ORAL | Status: DC | PRN
Start: 2021-09-21 — End: 2021-09-21

## 2021-09-21 SURGICAL SUPPLY — 51 items
BAG COUNTER SPONGE SURGICOUNT (BAG) ×2 IMPLANT
BASKET BONE COLLECTION (BASKET) ×2 IMPLANT
BENZOIN TINCTURE PRP APPL 2/3 (GAUZE/BANDAGES/DRESSINGS) ×2 IMPLANT
BLADE CLIPPER SURG (BLADE) IMPLANT
BONE MATRIX OSTEOCEL PRO LRG (Bone Implant) ×2 IMPLANT
BUR CARBIDE MATCH 3.0 (BURR) ×2 IMPLANT
CANISTER SUCT 3000ML PPV (MISCELLANEOUS) ×2 IMPLANT
CNTNR URN SCR LID CUP LEK RST (MISCELLANEOUS) ×1 IMPLANT
CONT SPEC 4OZ STRL OR WHT (MISCELLANEOUS) ×1
COVER BACK TABLE 60X90IN (DRAPES) ×2 IMPLANT
DERMABOND ADVANCED (GAUZE/BANDAGES/DRESSINGS) ×1
DERMABOND ADVANCED .7 DNX12 (GAUZE/BANDAGES/DRESSINGS) ×1 IMPLANT
DRAPE C-ARM 42X72 X-RAY (DRAPES) ×8 IMPLANT
DRAPE C-ARMOR (DRAPES) IMPLANT
DRAPE LAPAROTOMY 100X72X124 (DRAPES) ×2 IMPLANT
DRAPE SURG 17X23 STRL (DRAPES) ×2 IMPLANT
DRSG OPSITE POSTOP 4X6 (GAUZE/BANDAGES/DRESSINGS) ×2 IMPLANT
DURAPREP 26ML APPLICATOR (WOUND CARE) ×2 IMPLANT
ELECT REM PT RETURN 9FT ADLT (ELECTROSURGICAL) ×2
ELECTRODE REM PT RTRN 9FT ADLT (ELECTROSURGICAL) ×1 IMPLANT
EVACUATOR 1/8 PVC DRAIN (DRAIN) ×2 IMPLANT
GAUZE 4X4 16PLY ~~LOC~~+RFID DBL (SPONGE) IMPLANT
GLOVE SURG ENC MOIS LTX SZ7 (GLOVE) IMPLANT
GLOVE SURG ENC MOIS LTX SZ8 (GLOVE) ×4 IMPLANT
GLOVE SURG UNDER POLY LF SZ7 (GLOVE) IMPLANT
GOWN STRL REUS W/ TWL LRG LVL3 (GOWN DISPOSABLE) ×2 IMPLANT
GOWN STRL REUS W/ TWL XL LVL3 (GOWN DISPOSABLE) ×1 IMPLANT
GOWN STRL REUS W/TWL 2XL LVL3 (GOWN DISPOSABLE) IMPLANT
GOWN STRL REUS W/TWL LRG LVL3 (GOWN DISPOSABLE) ×2
GOWN STRL REUS W/TWL XL LVL3 (GOWN DISPOSABLE) ×1
HEMOSTAT POWDER KIT SURGIFOAM (HEMOSTASIS) ×2 IMPLANT
KIT BASIN OR (CUSTOM PROCEDURE TRAY) ×2 IMPLANT
KIT GRAFTMAG DEL NEURO DISP (NEUROSURGERY SUPPLIES) IMPLANT
KIT TURNOVER KIT B (KITS) ×2 IMPLANT
MILL MEDIUM DISP (BLADE) IMPLANT
NEEDLE HYPO 25X1 1.5 SAFETY (NEEDLE) ×2 IMPLANT
NS IRRIG 1000ML POUR BTL (IV SOLUTION) ×4 IMPLANT
PACK LAMINECTOMY NEURO (CUSTOM PROCEDURE TRAY) ×2 IMPLANT
PAD ARMBOARD 7.5X6 YLW CONV (MISCELLANEOUS) ×10 IMPLANT
SPONGE SURGIFOAM ABS GEL 100 (HEMOSTASIS) ×2 IMPLANT
SPONGE T-LAP 4X18 ~~LOC~~+RFID (SPONGE) IMPLANT
STRIP CLOSURE SKIN 1/2X4 (GAUZE/BANDAGES/DRESSINGS) ×4 IMPLANT
SUT VIC AB 0 CT1 18XCR BRD8 (SUTURE) ×2 IMPLANT
SUT VIC AB 0 CT1 8-18 (SUTURE) ×2
SUT VIC AB 2-0 CP2 18 (SUTURE) ×2 IMPLANT
SUT VIC AB 3-0 SH 8-18 (SUTURE) ×2 IMPLANT
SYR CONTROL 10ML LL (SYRINGE) ×2 IMPLANT
TOWEL GREEN STERILE (TOWEL DISPOSABLE) ×2 IMPLANT
TOWEL GREEN STERILE FF (TOWEL DISPOSABLE) ×2 IMPLANT
TRAY FOLEY MTR SLVR 16FR STAT (SET/KITS/TRAYS/PACK) ×2 IMPLANT
WATER STERILE IRR 1000ML POUR (IV SOLUTION) ×2 IMPLANT

## 2021-09-21 NOTE — Op Note (Signed)
09/21/2021  3:02 PM  PATIENT:  Sarah Spencer  65 y.o. female  PRE-OPERATIVE DIAGNOSIS: Severe lumbar spinal stenosis L2-3 L3-4 L4-5, spondylolisthesis L2-3 L4-5, back pain with leg pain and leg weakness with claudication  POST-OPERATIVE DIAGNOSIS:  same  PROCEDURE: 1. Decompressive lumbar laminectomy, medial facetectomy foraminotomies L2-3 L3-4 and L4-5 bilaterally, 2.  Intertransverse arthrodesis L2-3 L3-4 L4-5 on the right utilizing locally harvested morselized autologous bone graft and morselized allograft  SURGEON:  Marikay Alar, MD  ASSISTANTS: Verlin Dike FNP  ANESTHESIA:   General  EBL: 300 ml  Total I/O In: 1000 [I.V.:1000] Out: 450 [Urine:150; Blood:300]  BLOOD ADMINISTERED: none  DRAINS: Medium Hemovac  SPECIMEN:  none  INDICATION FOR PROCEDURE: This patient presented with back pain with claudication with weakness in the leg. Imaging showed severe lumbar spinal stenosis at L2-3, L3-4 and L4-5 grade 1 anterolisthesis L2-3 and L4-5. The patient tried conservative measures without relief. Pain was debilitating. Recommended decompression and noninstrumented fusion. Patient understood the risks, benefits, and alternatives and potential outcomes and wished to proceed.  PROCEDURE DETAILS: The patient was taken to the operating room and after induction of adequate generalized endotracheal anesthesia, the patient was rolled into the prone position on chest rolls and all pressure points were padded. The lumbar region was cleaned with Betadine scrub and then prepped with DuraPrep and draped in the usual sterile fashion. 5 cc of local anesthesia was injected and then a dorsal midline incision was made and carried down to the lumbo sacral fascia. The fascia was opened and the paraspinous musculature was taken down in a subperiosteal fashion to expose L2-3 L3-4 and L4-5 bilaterally. Intraoperative x-ray confirmed my level and all in the room agreed, and then I remove the spinous  processes of L2-L3 and L4 and used a combination of the high-speed drill and the Kerrison punches to perform a complete laminectomy, medial facetectomy, and foraminotomy at L2 3 L3-4 and L4-5 bilaterally. The underlying yellow ligament was opened and removed in a piecemeal fashion to expose the underlying dura and exiting nerve root. I undercut the lateral recess and dissected down until I was medial to and distal to the pedicle at each level.  SHe had very severe spinal stenosis and significant compression of the dura at each level.  The drill shavings and all small bone graft was saved for later arthrodesis during the decompression the nerve root was well decompressed bilaterally at each level. I then palpated with a coronary dilator along the nerve root bilaterally at each level and into the foramen to assure adequate decompression. I felt no more compression of the nerve roots and the central canal was well decompressed.   We dissected out over the transverse processes of L2, L3, L4, and L5 on the right.  We decorticated each of these.  We then placed a mixture of morselized allograft with our local autograft out over these to perform intertransverse arthrodesis L2, L3, L4, and L5 on the right.  I irrigated with saline solution containing bacitracin. Achieved hemostasis with bipolar cautery, lined the dura with Gelfoam, placed a medium Hemovac drain through a separate stab incision and then closed the fascia with 0 Vicryl. I closed the subcutaneous tissues with 2-0 Vicryl and the subcuticular tissues with 3-0 Vicryl. The skin was then closed with benzoin and Steri-Strips. The drapes were removed, a sterile dressing was applied.  My nurse practitioner was involved in the exposure, safe retraction of the neural elements, the fusion and the closure. the patient  was awakened from general anesthesia and transferred to the recovery room in stable condition. At the end of the procedure all sponge, needle and  instrument counts were correct.    PLAN OF CARE: Admit for overnight observation  PATIENT DISPOSITION:  PACU - hemodynamically stable.   Delay start of Pharmacological VTE agent (>24hrs) due to surgical blood loss or risk of bleeding:  yes

## 2021-09-21 NOTE — Anesthesia Procedure Notes (Addendum)
Procedure Name: Intubation Date/Time: 09/21/2021 12:12 PM Performed by: Lovie Chol, CRNA Pre-anesthesia Checklist: Patient identified, Emergency Drugs available, Suction available and Patient being monitored Patient Re-evaluated:Patient Re-evaluated prior to induction Oxygen Delivery Method: Circle System Utilized Preoxygenation: Pre-oxygenation with 100% oxygen Induction Type: IV induction Ventilation: Mask ventilation without difficulty Laryngoscope Size: Glidescope and 3 Grade View: Grade II Tube type: Oral Tube size: 7.0 mm Number of attempts: 1 Airway Equipment and Method: Rigid stylet Placement Confirmation: ETT inserted through vocal cords under direct vision, positive ETCO2 and breath sounds checked- equal and bilateral Secured at: 21 cm Tube secured with: Tape Dental Injury: Teeth and Oropharynx as per pre-operative assessment  Difficulty Due To: Difficulty was anticipated, Difficult Airway- due to reduced neck mobility, Difficult Airway- due to anterior larynx and Difficult Airway- due to limited oral opening Future Recommendations: Recommend- induction with short-acting agent, and alternative techniques readily available

## 2021-09-21 NOTE — Transfer of Care (Signed)
Immediate Anesthesia Transfer of Care Note  Patient: Sarah Spencer  Procedure(s) Performed: Laminectomy and Foraminotomy Lumbar Two - Lumbar Three, Lumbar Three - Lumbar Four, Lumbar Four - Lumbar Five - Bilateral, Posterolateral Fusion Lumbar Two - Lumbar Three, Lumbar Three - Lumbar Four, Lumbar Four - Lumbar Five (Bilateral: Spine Lumbar)  Patient Location: PACU  Anesthesia Type:General  Level of Consciousness: oriented, drowsy and patient cooperative  Airway & Oxygen Therapy: Patient Spontanous Breathing and Patient connected to face mask oxygen  Post-op Assessment: Report given to RN and Post -op Vital signs reviewed and stable  Post vital signs: Reviewed  Last Vitals:  Vitals Value Taken Time  BP 137/82 09/21/21 1514  Temp    Pulse 80 09/21/21 1519  Resp 14 09/21/21 1519  SpO2 100 % 09/21/21 1519  Vitals shown include unvalidated device data.  Last Pain:  Vitals:   09/21/21 1034  TempSrc: Oral  PainSc:          Complications: No notable events documented.

## 2021-09-21 NOTE — H&P (Signed)
Subjective: Patient is a 65 y.o. female admitted for lumbar spinal stenosis. Onset of symptoms was several years ago, gradually worsening since that time.  The pain is rated moderate, and is located at the across the lower back and radiates to hips and legs. The pain is described as aching and occurs intermittently. The symptoms have been progressive. Symptoms are exacerbated by exercise, standing, and walking for more than a few minutes. MRI or CT showed severe spinal stenosis with spondylolisthesis.  She has a history of osteoporosis.  Her biggest complaint is weakness in the legs.  Past Medical History:  Diagnosis Date   Allergies    Anxiety    Arthritis    Bronchitis    Depression    Family history of adverse reaction to anesthesia    sister-extreme N&V   GERD (gastroesophageal reflux disease)    Hyperlipidemia    Obesity    Osteoporosis    PONV (postoperative nausea and vomiting)    Vitamin D deficiency     Past Surgical History:  Procedure Laterality Date   CERVICAL DISC SURGERY     CESAREAN SECTION     x 1   COLONOSCOPY     ESOPHAGOGASTRODUODENOSCOPY     laparascopy     laporoscopy     TONSILLECTOMY      Prior to Admission medications   Medication Sig Start Date End Date Taking? Authorizing Provider  atorvastatin (LIPITOR) 20 MG tablet Take 20 mg by mouth at bedtime.   Yes [provider]  citalopram (CELEXA) 20 MG tablet Take 20 mg by mouth daily.   Yes [provider]  clonazePAM (KLONOPIN) 0.5 MG tablet Take 0.25-0.5 mg by mouth 3 (three) times daily as needed for anxiety. 07/26/20  Yes [provider]  famotidine (PEPCID) 20 MG tablet Take 20 mg by mouth 2 (two) times daily.   Yes [provider]  montelukast (SINGULAIR) 10 MG tablet Take 10 mg by mouth at bedtime. 08/15/20  Yes [provider]  Propylene Glycol (SYSTANE COMPLETE) 0.6 % SOLN Place 1 drop into both eyes 2 (two) times daily as needed (dry eyes).   Yes [provider]  albuterol (VENTOLIN HFA) 108 (90 Base) MCG/ACT inhaler Inhale 2 puffs into the lungs every 6 (six) hours as needed. 02/11/21   Hughie Closs, PA-C  Cholecalciferol (VITAMIN D3) 50 MCG (2000 UT) TABS Take 2,000 Units by mouth daily.    [provider]  Coenzyme Q10 (COQ10) 100 MG CAPS Take 100 mg by mouth daily.    [provider]  diclofenac (VOLTAREN) 75 MG EC tablet Take 75 mg by mouth daily. 07/06/20   [provider]  fluticasone (FLONASE) 50 MCG/ACT nasal spray Place 2 sprays into both nostrils daily. Patient not taking: Reported on 09/13/2021 02/11/21   Hughie Closs, PA-C  hyoscyamine (LEVSIN SL) 0.125 MG SL tablet Place 1 tablet (0.125 mg total) under the tongue every 4 (four) hours as needed (Abdominal pain and spasms). 09/14/20   Esterwood, Amy S, PA-C  niacin 500 MG tablet Take 500 mg by mouth at bedtime.    [provider]  PEG-KCl-NaCl-NaSulf-Na Asc-C (PLENVU) 140 g SOLR Take 1 kit by mouth as directed. 09/14/20   Esterwood, Amy S, PA-C   Allergies  Allergen Reactions   Cefdinir Itching   Wellbutrin [Bupropion] Nausea And Vomiting    Social History   Tobacco Use   Smoking status: Never   Smokeless tobacco: Never  Substance Use Topics  Alcohol use: No    Family History  Problem Relation Age of Onset   Heart disease Father    Heart disease Sister    Other Brother        step brother   Heart disease Sister    Stroke Sister        caused by medicine   Colon cancer Neg Hx    Esophageal cancer Neg Hx    Rectal cancer Neg Hx      Review of Systems  Positive ROS: Negative  All other systems have been reviewed and were otherwise negative with the exception of those mentioned in the HPI and as above.  Objective: Vital signs in last 24 hours: Temp:  [97.8 F (36.6 C)-98.6 F (37 C)] 97.8 F (36.6 C) (12/09 1034) Pulse Rate:  [91-98] 98 (12/09 1034) Resp:  [18] 18 (12/09 1034) BP: (156)/(76) 156/76 (12/09  0910) SpO2:  [96 %-99 %] 99 % (12/09 1034) Weight:  [107.5 kg] 107.5 kg (12/09 0910)  General Appearance: Alert, cooperative, no distress, appears stated age Head: Normocephalic, without obvious abnormality, atraumatic Eyes: PERRL, conjunctiva/corneas clear, EOM's intact    Neck: Supple, symmetrical, trachea midline Back: Symmetric, no curvature, ROM normal, no CVA tenderness Lungs:  respirations unlabored Heart: Regular rate and rhythm Abdomen: Soft, non-tender Extremities: Extremities normal, atraumatic, no cyanosis or edema Pulses: 2+ and symmetric all extremities Skin: Skin color, texture, turgor normal, no rashes or lesions  NEUROLOGIC:   Mental status: Alert and oriented x4,  no aphasia, good attention span, fund of knowledge, and memory Motor Exam - grossly normal Sensory Exam - grossly normal Reflexes: Trace Coordination - grossly normal Gait - grossly normal Balance - grossly normal Cranial Nerves: I: smell Not tested  II: visual acuity  OS: nl    OD: nl  II: visual fields Full to confrontation  II: pupils Equal, round, reactive to light  III,VII: ptosis None  III,IV,VI: extraocular muscles  Full ROM  V: mastication Normal  V: facial light touch sensation  Normal  V,VII: corneal reflex  Present  VII: facial muscle function - upper  Normal  VII: facial muscle function - lower Normal  VIII: hearing Not tested  IX: soft palate elevation  Normal  IX,X: gag reflex Present  XI: trapezius strength  5/5  XI: sternocleidomastoid strength 5/5  XI: neck flexion strength  5/5  XII: tongue strength  Normal    Data Review Lab Results  Component Value Date   WBC 6.4 09/18/2021   HGB 13.7 09/18/2021   HCT 42.7 09/18/2021   MCV 89.5 09/18/2021   PLT 346 09/18/2021   Lab Results  Component Value Date   NA 138 09/18/2021   K 4.3 09/18/2021   CL 102 09/18/2021   CO2 29 09/18/2021   BUN 13 09/18/2021   CREATININE 0.87 09/18/2021   GLUCOSE 99 09/18/2021   Lab  Results  Component Value Date   INR 1.0 09/18/2021    Assessment/Plan:  Estimated body mass index is 39.44 kg/m as calculated from the following:   Height as of this encounter: 5' 5"  (1.651 m).   Weight as of this encounter: 107.5 kg. Patient admitted for decompressive laminectomy L2-L3 4 and L4-5 for spinal stenosis with a noninstrumented fusion. Patient has failed a reasonable attempt at conservative therapy.  I explained the condition and procedure to the patient and answered any questions.  Patient wishes to proceed with procedure as planned. Understands risks/ benefits and typical outcomes of procedure.  Eustace Moore 09/21/2021 11:12 AM

## 2021-09-22 DIAGNOSIS — M48062 Spinal stenosis, lumbar region with neurogenic claudication: Secondary | ICD-10-CM | POA: Diagnosis not present

## 2021-09-22 MED ORDER — METHOCARBAMOL 500 MG PO TABS: 500.0000 mg | ORAL_TABLET | Freq: Four times a day (QID) | ORAL | 0 refills | Status: DC

## 2021-09-22 MED ORDER — OXYCODONE-ACETAMINOPHEN 5-325 MG PO TABS: 1.0000 | ORAL_TABLET | ORAL | 0 refills | Status: AC | PRN

## 2021-09-22 NOTE — Evaluation (Addendum)
Physical Therapy Evaluation and Discharge Patient Details Name: Sarah Spencer MRN: [id number] DOB: 07/21/1956 Today's Date: 09/22/2021  History of Present Illness  THis 65 y.o. female admitted with lumbar stenosis.  She underwent intratransverse arthodesis L2-3, L3-4, and L4-5 bil. PMH includes: anxiety and depression , obesity, osteoporosis, GERD, s/p cervical disc surgery  Clinical Impression   Patient evaluated by Physical Therapy with no further acute PT needs identified. All education has been completed and the patient has no further questions.  PT is signing off. Thank you for this referral.        Recommendations for follow up therapy are one component of a multi-disciplinary discharge planning process, led by the attending physician.  Recommendations may be updated based on patient status, additional functional criteria and insurance authorization.  Follow Up Recommendations No PT follow up    Assistance Recommended at Discharge Set up Supervision/Assistance  Functional Status Assessment Patient has had a recent decline in their functional status and demonstrates the ability to make significant improvements in function in a reasonable and predictable amount of time.  Equipment Recommendations  None recommended by PT    Recommendations for Other Services OT consult     Precautions / Restrictions Precautions Precautions: Back Precaution Booklet Issued: Yes (comment) Precaution Comments: pt able to state 3/3 precautions prior to reviewing handout Required Braces or Orthoses: Spinal Brace Spinal Brace: Lumbar corset;Applied in sitting position Restrictions Weight Bearing Restrictions: No      Mobility  Bed Mobility Overal bed mobility: Needs Assistance Bed Mobility: Rolling;Sidelying to Sit;Sit to Sidelying Rolling: Supervision Sidelying to sit: Supervision     Sit to sidelying: Supervision General bed mobility comments: vc for technique, but no assist needed     Transfers Overall transfer level: Needs assistance Equipment used: Rolling walker (2 wheels) Transfers: Sit to/from Stand Sit to Stand: Supervision           General transfer comment: vc for proper use of RW; hand placement    Ambulation/Gait Ambulation/Gait assistance: Supervision Gait Distance (Feet): 200 Feet Assistive device: Rolling walker (2 wheels) Gait Pattern/deviations: WFL(Within Functional Limits)   Gait velocity interpretation: 1.31 - 2.62 ft/sec, indicative of limited community ambulator   General Gait Details: slight decr velocity; vc for turning with RW to avoid twisting with good return demonstration  Stairs Stairs: Yes Stairs assistance: Min guard Stair Management: Forwards;With walker Number of Stairs: 2 General stair comments: simulated up curb with good return demonstration  Wheelchair Mobility    Modified Rankin (Stroke Patients Only)       Balance                                             Pertinent Vitals/Pain Pain Assessment: 0-10 Pain Score: 3  Pain Location: back, hips Pain Descriptors / Indicators: Numbness;Sore Pain Intervention(s): Limited activity within patient's tolerance    Home Living Family/patient expects to be discharged to:: Private residence Living Arrangements: Children Available Help at Discharge: Family;Available 24 hours/day Type of Home: House Home Access: Stairs to enter Entrance Stairs-Rails: None Entrance Stairs-Number of Steps: 1+1   Home Layout: Two level;Full bath on main level;Able to live on main level with bedroom/bathroom Home Equipment: Rolling Walker (2 wheels);Adaptive equipment      Prior Function Prior Level of Function : Independent/Modified Independent;Driving  Hand Dominance        Extremity/Trunk Assessment   Upper Extremity Assessment Upper Extremity Assessment: Defer to OT evaluation    Lower Extremity Assessment Lower  Extremity Assessment: Overall WFL for tasks assessed (reports "bad knees" with left worst; functionally did well)    Cervical / Trunk Assessment Cervical / Trunk Assessment: Back Surgery  Communication   Communication: No difficulties  Cognition Arousal/Alertness: Awake/alert Behavior During Therapy: WFL for tasks assessed/performed Overall Cognitive Status: Within Functional Limits for tasks assessed                                          General Comments      Exercises     Assessment/Plan    PT Assessment Patient does not need any further PT services  PT Problem List         PT Treatment Interventions      PT Goals (Current goals can be found in the Care Plan section)  Acute Rehab PT Goals Patient Stated Goal: go home today PT Goal Formulation: All assessment and education complete, DC therapy    Frequency     Barriers to discharge        Co-evaluation               AM-PAC PT "6 Clicks" Mobility  Outcome Measure Help needed turning from your back to your side while in a flat bed without using bedrails?: A Little Help needed moving from lying on your back to sitting on the side of a flat bed without using bedrails?: A Little Help needed moving to and from a bed to a chair (including a wheelchair)?: A Little Help needed standing up from a chair using your arms (e.g., wheelchair or bedside chair)?: A Little Help needed to walk in hospital room?: A Little Help needed climbing 3-5 steps with a railing? : A Little 6 Click Score: 18    End of Session Equipment Utilized During Treatment: Back brace Activity Tolerance: Patient tolerated treatment well Patient left: in bed;with call bell/phone within reach Nurse Communication: Mobility status;Other (comment) (no PT needs) PT Visit Diagnosis: Pain Pain - Right/Left:  (midline) Pain - part of body:  (back)    Time: 9381-0175 PT Time Calculation (min) (ACUTE ONLY): 23 min   Charges:   PT  Evaluation $PT Eval Low Complexity: 1 Low           Jerolyn Center, PT Acute Rehabilitation Services  Pager [phone] Office [phone]   Zena Amos 09/22/2021, 8:16 AM

## 2021-09-22 NOTE — Evaluation (Signed)
Occupational Therapy Evaluation Patient Details Name: Sarah Spencer MRN: [id number] DOB: 1955-11-14 Today's Date: 09/22/2021   History of Present Illness THis 65 y.o. female admitted with lumbar stenosis.  She underwent intratransverse arthodesis L2-3, L3-4, and L4-5 bil. PMH includes: anxiety and depression , obesity, osteoporosis, GERD, s/p cervical disc surgery   Clinical Impression   Pt admitted for procedure listed above. PTA pt reported independence with decreased activity tolerance.  At this time, pt able to complete all ADL's with compensatory strategies, and using a RW as needed for stability. Pt very motivated to continue working on mobility. She has no further OT needs and acute OT will sign off.       Recommendations for follow up therapy are one component of a multi-disciplinary discharge planning process, led by the attending physician.  Recommendations may be updated based on patient status, additional functional criteria and insurance authorization.   Follow Up Recommendations  No OT follow up    Assistance Recommended at Discharge Set up Supervision/Assistance  Functional Status Assessment  Patient has had a recent decline in their functional status and demonstrates the ability to make significant improvements in function in a reasonable and predictable amount of time.  Equipment Recommendations  None recommended by OT    Recommendations for Other Services       Precautions / Restrictions Precautions Precautions: Back Precaution Booklet Issued: Yes (comment) Precaution Comments: pt able to state 3/3 precautions prior to reviewing handout, reviewed compensatory strategies Required Braces or Orthoses: Spinal Brace Spinal Brace: Lumbar corset;Applied in sitting position Restrictions Weight Bearing Restrictions: No      Mobility Bed Mobility Overal bed mobility: Needs Assistance Bed Mobility: Rolling;Sidelying to Sit;Sit to Sidelying Rolling:  Supervision Sidelying to sit: Supervision     Sit to sidelying: Supervision General bed mobility comments: vc for technique, but no assist needed    Transfers Overall transfer level: Needs assistance Equipment used: Rolling walker (2 wheels) Transfers: Sit to/from Stand Sit to Stand: Supervision           General transfer comment: vc for proper use of RW; hand placement      Balance                                           ADL either performed or assessed with clinical judgement   ADL Overall ADL's : Modified independent;At baseline                                       General ADL Comments: Pt completed all ADL's with compensatory strategies, no difficulties     Vision Baseline Vision/History: 0 No visual deficits Ability to See in Adequate Light: 0 Adequate Patient Visual Report: No change from baseline Vision Assessment?: No apparent visual deficits     Perception     Praxis      Pertinent Vitals/Pain Pain Assessment: 0-10 Pain Score: 2  Pain Location: back, hips Pain Descriptors / Indicators: Numbness;Sore Pain Intervention(s): Monitored during session;Repositioned     Hand Dominance Right   Extremity/Trunk Assessment Upper Extremity Assessment Upper Extremity Assessment: Overall WFL for tasks assessed   Lower Extremity Assessment Lower Extremity Assessment: Defer to PT evaluation   Cervical / Trunk Assessment Cervical / Trunk Assessment: Back Surgery   Communication Communication Communication: No difficulties  Cognition Arousal/Alertness: Awake/alert Behavior During Therapy: WFL for tasks assessed/performed Overall Cognitive Status: Within Functional Limits for tasks assessed                                       General Comments  VSS on RA, all questions answered.    Exercises     Shoulder Instructions      Home Living Family/patient expects to be discharged to:: Private  residence Living Arrangements: Children Available Help at Discharge: Family;Available 24 hours/day Type of Home: House Home Access: Stairs to enter [employer] of Steps: 1+1 Entrance Stairs-Rails: None Home Layout: Two level;Full bath on main level;Able to live on main level with bedroom/bathroom     Bathroom Shower/Tub: Arts development officer Toilet: Handicapped height     Home Equipment: Agricultural consultant (2 wheels);Adaptive equipment Adaptive Equipment: Reacher        Prior Functioning/Environment Prior Level of Function : Independent/Modified Independent;Driving                        OT Problem List: Decreased strength;Decreased activity tolerance;Impaired balance (sitting and/or standing);Decreased knowledge of use of DME or AE;Pain      OT Treatment/Interventions:      OT Goals(Current goals can be found in the care plan section) Acute Rehab OT Goals Patient Stated Goal: To go home OT Goal Formulation: With patient Time For Goal Achievement: 09/22/21 Potential to Achieve Goals: Good  OT Frequency:     Barriers to D/C:            Co-evaluation              AM-PAC OT "6 Clicks" Daily Activity     Outcome Measure Help from another person eating meals?: None Help from another person taking care of personal grooming?: None Help from another person toileting, which includes using toliet, bedpan, or urinal?: None Help from another person bathing (including washing, rinsing, drying)?: None Help from another person to put on and taking off regular upper body clothing?: None Help from another person to put on and taking off regular lower body clothing?: None 6 Click Score: 24   End of Session Equipment Utilized During Treatment: Back brace;Rolling walker (2 wheels) Nurse Communication: Mobility status  Activity Tolerance: Patient tolerated treatment well Patient left: in bed;with call bell/phone within reach;with nursing/sitter in  room  OT Visit Diagnosis: Unsteadiness on feet (R26.81);Muscle weakness (generalized) (M62.81)                Time: 2122-4825 OT Time Calculation (min): 38 min Charges:  OT General Charges $OT Visit: 1 Visit OT Evaluation $OT Eval Low Complexity: 1 Low OT Treatments $Self Care/Home Management : 23-37 mins  Sarah Spencer., OTR/L Acute Rehabilitation  Sarah Spencer 09/22/2021, 9:12 AM

## 2021-09-22 NOTE — Progress Notes (Signed)
Patient is discharged from room 3C11 at this time. Alert and in stable condition. IV site d/c'd and instructions read to patient and daughter with understanding verbalized and all questions answered. Left unit via wheelchair with all belongings at side.  

## 2021-09-22 NOTE — Discharge Summary (Signed)
Physician Discharge Summary  Patient ID: Sarah Spencer MRN: [id number] DOB/AGE: 66/27/1957 66 y.o.  Admit date: 09/21/2021 Discharge date: 09/22/2021  Admission Diagnoses: Severe lumbar spinal stenosis L2-3 L3-4 L4-5, spondylolisthesis L2-3 L4-5, back pain with leg pain and leg weakness with claudication  Discharge Diagnoses: Severe lumbar spinal stenosis L2-3 L3-4 L4-5, spondylolisthesis L2-3 L4-5, back pain with leg pain and leg weakness with claudication Principal Problem:   S/P lumbar laminectomy   Discharged Condition: good  Hospital Course: The patient was admitted on 09/21/2021 and taken to the operating room where the patient underwent decompressive laminectomy L2-L3 4 and L4-5 for spinal stenosis with a noninstrumented fusion. The patient tolerated the procedure well and was taken to the recovery room and then to the floor in stable condition. The hospital course was routine. There were no complications. The wound remained clean dry and intact. Pt had appropriate back soreness. No complaints of leg pain or new N/T/W. The patient remained afebrile with stable vital signs, and tolerated a regular diet. The patient continued to increase activities, and pain was well controlled with oral pain medications.   Consults: None  Significant Diagnostic Studies: radiology: X-Ray: intraoperative   Treatments: surgery:  1. Decompressive lumbar laminectomy, medial facetectomy foraminotomies L2-3 L3-4 and L4-5 bilaterally,  2.  Intertransverse arthrodesis L2-3 L3-4 L4-5 on the right utilizing locally harvested morselized autologous bone graft and morselized allograft    Discharge Exam: Blood pressure 126/73, pulse 84, temperature 98.2 F (36.8 C), temperature source Oral, resp. rate 18, height 5' 5"  (1.651 m), weight 107.5 kg, SpO2 94 %.  Physical Exam: Patient is awake, A/O X 4, conversant, and in good spirits. They are in NAD and VSS. Doing well. Speech is fluent and appropriate. MAEW.  Sensation to light touch is intact. PERLA, EOMI. CNs grossly intact. Dressing is clean dry intact. Incision is well approximated with no drainage, erythema, or edema. LSO brace in place      Disposition: Discharge disposition: 01-Home or Self Care        Allergies as of 09/22/2021       Reactions   Cefdinir Itching   Wellbutrin [bupropion] Nausea And Vomiting        Medication List     TAKE these medications    albuterol 108 (90 Base) MCG/ACT inhaler Commonly known as: VENTOLIN HFA Inhale 2 puffs into the lungs every 6 (six) hours as needed.   atorvastatin 20 MG tablet Commonly known as: LIPITOR Take 20 mg by mouth at bedtime.   citalopram 20 MG tablet Commonly known as: CELEXA Take 20 mg by mouth daily.   clonazePAM 0.5 MG tablet Commonly known as: KLONOPIN Take 0.25-0.5 mg by mouth 3 (three) times daily as needed for anxiety.   CoQ10 100 MG Caps Take 100 mg by mouth daily.   diclofenac 75 MG EC tablet Commonly known as: VOLTAREN Take 75 mg by mouth daily.   famotidine 20 MG tablet Commonly known as: PEPCID Take 20 mg by mouth 2 (two) times daily.   fluticasone 50 MCG/ACT nasal spray Commonly known as: FLONASE Place 2 sprays into both nostrils daily.   hyoscyamine 0.125 MG SL tablet Commonly known as: LEVSIN SL Place 1 tablet (0.125 mg total) under the tongue every 4 (four) hours as needed (Abdominal pain and spasms).   methocarbamol 500 MG tablet Commonly known as: Robaxin Take 1 tablet (500 mg total) by mouth 4 (four) times daily.   montelukast 10 MG tablet Commonly known as: SINGULAIR Take 10  mg by mouth at bedtime.   niacin 500 MG tablet Take 500 mg by mouth at bedtime.   oxyCODONE-acetaminophen 5-325 MG tablet Commonly known as: Percocet Take 1 tablet by mouth every 4 (four) hours as needed for severe pain.   Plenvu 140 g Solr Generic drug: PEG-KCl-NaCl-NaSulf-Na Asc-C Take 1 kit by mouth as directed.   Systane Complete 0.6 %  Soln Generic drug: Propylene Glycol Place 1 drop into both eyes 2 (two) times daily as needed (dry eyes).   Vitamin D3 50 MCG (2000 UT) Tabs Take 2,000 Units by mouth daily.         Signed: Marvis Moeller, DNP, NP-C 09/22/2021, 9:10 AM

## 2021-09-24 ENCOUNTER — Encounter (HOSPITAL_COMMUNITY): Payer: Self-pay | Admitting: Neurological Surgery

## 2021-09-24 NOTE — Anesthesia Postprocedure Evaluation (Signed)
Anesthesia Post Note  Patient: PAYTIN RAMAKRISHNAN  Procedure(s) Performed: Laminectomy and Foraminotomy Lumbar Two - Lumbar Three, Lumbar Three - Lumbar Four, Lumbar Four - Lumbar Five - Bilateral, Posterolateral Fusion Lumbar Two - Lumbar Three, Lumbar Three - Lumbar Four, Lumbar Four - Lumbar Five (Bilateral: Spine Lumbar)     Patient location during evaluation: PACU Anesthesia Type: General Level of consciousness: awake and alert Pain management: pain level controlled Vital Signs Assessment: post-procedure vital signs reviewed and stable Respiratory status: spontaneous breathing, nonlabored ventilation, respiratory function stable and patient connected to nasal cannula oxygen Cardiovascular status: blood pressure returned to baseline and stable Postop Assessment: no apparent nausea or vomiting Anesthetic complications: no   No notable events documented.  Last Vitals:  Vitals:   09/22/21 0334 09/22/21 0909  BP: 129/83 126/73  Pulse: 78 84  Resp: 18 18  Temp: 36.7 C 36.8 C  SpO2: 96% 94%    Last Pain:  Vitals:   09/22/21 0909  TempSrc: Oral  PainSc:                  Issaac Shipper

## 2021-09-27 ENCOUNTER — Emergency Department (HOSPITAL_COMMUNITY): Payer: Medicare Other

## 2021-09-27 ENCOUNTER — Ambulatory Visit (HOSPITAL_COMMUNITY)
Admission: EM | Admit: 2021-09-27 | Discharge: 2021-09-27 | Disposition: A | Discharge status: Home / Self Care | Payer: Medicare Other

## 2021-09-27 ENCOUNTER — Other Ambulatory Visit: Payer: Self-pay

## 2021-09-27 ENCOUNTER — Emergency Department (HOSPITAL_COMMUNITY)
Admission: EM | Admit: 2021-09-27 | Discharge: 2021-09-28 | Disposition: A | Discharge status: Home / Self Care | Payer: Medicare Other | Attending: Emergency Medicine | Admitting: Emergency Medicine

## 2021-09-27 ENCOUNTER — Encounter (HOSPITAL_COMMUNITY): Payer: Self-pay | Admitting: Emergency Medicine

## 2021-09-27 DIAGNOSIS — K5900 Constipation, unspecified: Secondary | ICD-10-CM | POA: Diagnosis present

## 2021-09-27 DIAGNOSIS — K219 Gastro-esophageal reflux disease without esophagitis: Secondary | ICD-10-CM | POA: Insufficient documentation

## 2021-09-27 DIAGNOSIS — K5903 Drug induced constipation: Secondary | ICD-10-CM | POA: Insufficient documentation

## 2021-09-27 LAB — CBC WITH DIFFERENTIAL/PLATELET
Abs Immature Granulocytes: 0.03 10*3/uL (ref 0.00–0.07)
Basophils Absolute: 0.1 10*3/uL (ref 0.0–0.1)
Basophils Relative: 0 %
Eosinophils Absolute: 0.1 10*3/uL (ref 0.0–0.5)
Eosinophils Relative: 1 %
HCT: 37.4 % (ref 36.0–46.0)
Hemoglobin: 12.1 g/dL (ref 12.0–15.0)
Immature Granulocytes: 0 %
Lymphocytes Relative: 11 %
Lymphs Abs: 1.3 10*3/uL (ref 0.7–4.0)
MCH: 28.6 pg (ref 26.0–34.0)
MCHC: 32.4 g/dL (ref 30.0–36.0)
MCV: 88.4 fL (ref 80.0–100.0)
Monocytes Absolute: 0.7 10*3/uL (ref 0.1–1.0)
Monocytes Relative: 6 %
Neutro Abs: 9.7 10*3/uL — ABNORMAL HIGH (ref 1.7–7.7)
Neutrophils Relative %: 82 %
Platelets: 436 10*3/uL — ABNORMAL HIGH (ref 150–400)
RBC: 4.23 MIL/uL (ref 3.87–5.11)
RDW: 13.6 % (ref 11.5–15.5)
WBC: 11.7 10*3/uL — ABNORMAL HIGH (ref 4.0–10.5)
nRBC: 0 % (ref 0.0–0.2)

## 2021-09-27 LAB — COMPREHENSIVE METABOLIC PANEL
ALT: 18 U/L (ref 0–44)
AST: 23 U/L (ref 15–41)
Albumin: 3.2 g/dL — ABNORMAL LOW (ref 3.5–5.0)
Alkaline Phosphatase: 79 U/L (ref 38–126)
Anion gap: 8 (ref 5–15)
BUN: 12 mg/dL (ref 8–23)
CO2: 29 mmol/L (ref 22–32)
Calcium: 8.8 mg/dL — ABNORMAL LOW (ref 8.9–10.3)
Chloride: 97 mmol/L — ABNORMAL LOW (ref 98–111)
Creatinine, Ser: 0.81 mg/dL (ref 0.44–1.00)
GFR, Estimated: >60 mL/min (ref >60)
Glucose, Bld: 114 mg/dL — ABNORMAL HIGH (ref 70–99)
Potassium: 3.9 mmol/L (ref 3.5–5.1)
Sodium: 134 mmol/L — ABNORMAL LOW (ref 135–145)
Total Bilirubin: 0.5 mg/dL (ref 0.3–1.2)
Total Protein: 6.3 g/dL — ABNORMAL LOW (ref 6.5–8.1)

## 2021-09-27 MED ORDER — LIDOCAINE HCL URETHRAL/MUCOSAL 2 % EX GEL
1.0000 "application " | Freq: Once | CUTANEOUS | Status: AC
  Administered 2021-09-28: 1 via TOPICAL
  Filled 2021-09-27: qty 11

## 2021-09-27 MED ORDER — OXYCODONE-ACETAMINOPHEN 5-325 MG PO TABS
1.0000 | ORAL_TABLET | Freq: Once | ORAL | Status: AC
  Administered 2021-09-28: 1 via ORAL
  Filled 2021-09-27: qty 1

## 2021-09-27 NOTE — ED Provider Notes (Signed)
**Sarah Sarah** Sarah Sarah   CSN: [id number] Arrival date & time: 09/27/21  1731     History Chief Complaint  Patient presents with   Constipation    Sarah Sarah is a 65 y.o. female.  HPI    65yo female with history of recent laminectomy/fusion 12/9 with Dr. Ronnald Ramp who presents with concern for constipation.   Has not had BM since surgery, and unsure last BM prior to the surgery Suppositories at nighttime Hemorrhoid cream Stool softeners two times a day Milk of magnesia From before the surgery was constipated as well 12/9 was surgery, taking oxycodone-acetaminophen once every 4 hours, muscle spasm medicine Nausea, no vomiting No fevers Since coming to the ED has developed dysuria Feels like abdomen is swollen, hurts where hemorrhoids are  Not eating or drinking well    Past Medical History:  Diagnosis Date   Allergies    Anxiety    Arthritis    Bronchitis    Depression    Family history of adverse reaction to anesthesia    sister-extreme N&V   GERD (gastroesophageal reflux disease)    Hyperlipidemia    Obesity    Osteoporosis    PONV (postoperative nausea and vomiting)    Vitamin D deficiency     Patient Active Problem List   Diagnosis Date Noted   S/P lumbar laminectomy 09/21/2021    Past Surgical History:  Procedure Laterality Date   CERVICAL DISC SURGERY     CESAREAN SECTION     x 1   COLONOSCOPY     ESOPHAGOGASTRODUODENOSCOPY     LAMINECTOMY WITH POSTERIOR LATERAL ARTHRODESIS LEVEL 3 Bilateral 09/21/2021   Procedure: Laminectomy and Foraminotomy Lumbar Two - Lumbar Three, Lumbar Three - Lumbar Four, Lumbar Four - Lumbar Five - Bilateral, Posterolateral Fusion Lumbar Two - Lumbar Three, Lumbar Three - Lumbar Four, Lumbar Four - Lumbar Five;  Surgeon: Eustace Moore, MD;  Location: [facility];  Service: Neurosurgery;  Laterality: Bilateral;   laparascopy     laporoscopy     TONSILLECTOMY       OB History    No obstetric history on file.     Family History  Problem Relation Age of Onset   Heart disease Father    Heart disease Sister    Other Brother        step brother   Heart disease Sister    Stroke Sister        caused by medicine   Colon cancer Neg Hx    Esophageal cancer Neg Hx    Rectal cancer Neg Hx     Social History   Tobacco Use   Smoking status: Never   Smokeless tobacco: Never  Vaping Use   Vaping Use: Never used  Substance Use Topics   Alcohol use: No   Drug use: No    Home Medications Prior to Admission medications   Medication Sig Start Date End Date Taking? Authorizing Provider  lidocaine (XYLOCAINE) 2 % jelly Apply 1 application topically 3 (three) times daily as needed for up to 7 days (one application up to three times daily around your rectum). 09/28/21 10/05/21 Yes Shanita Kanan, Junie Panning, MD  senna-docusate (SENOKOT-S) 8.6-50 MG tablet Take 2 tablets by mouth 2 (two) times daily for 7 days. 09/28/21 10/05/21 Yes Gareth Morgan, MD  albuterol (VENTOLIN HFA) 108 (90 Base) MCG/ACT inhaler Inhale 2 puffs into the lungs every 6 (six) hours as needed. 02/11/21   Hughie Closs, PA-C  atorvastatin (LIPITOR) 20 MG tablet Take 20 mg by mouth at bedtime.    [provider]  Cholecalciferol (VITAMIN D3) 50 MCG (2000 UT) TABS Take 2,000 Units by mouth daily.    [provider]  citalopram (CELEXA) 20 MG tablet Take 20 mg by mouth daily.    [provider]  clonazePAM (KLONOPIN) 0.5 MG tablet Take 0.25-0.5 mg by mouth 3 (three) times daily as needed for anxiety. 07/26/20   [provider]  Coenzyme Q10 (COQ10) 100 MG CAPS Take 100 mg by mouth daily.    [provider]  diclofenac (VOLTAREN) 75 MG EC tablet Take 75 mg by mouth daily. 07/06/20   [provider]  famotidine (PEPCID) 20 MG tablet Take 20 mg by mouth 2 (two) times daily.    [provider]  fluticasone (FLONASE) 50 MCG/ACT nasal spray Place 2  sprays into both nostrils daily. Patient not taking: Reported on 09/13/2021 02/11/21   Hughie Closs, PA-C  hyoscyamine (LEVSIN SL) 0.125 MG SL tablet Place 1 tablet (0.125 mg total) under the tongue every 4 (four) hours as needed (Abdominal pain and spasms). 09/14/20   Esterwood, Amy S, PA-C  methocarbamol (ROBAXIN) 500 MG tablet Take 1 tablet (500 mg total) by mouth 4 (four) times daily. 09/22/21   Marvis Moeller, NP  montelukast (SINGULAIR) 10 MG tablet Take 10 mg by mouth at bedtime. 08/15/20   [provider]  niacin 500 MG tablet Take 500 mg by mouth at bedtime.    [provider]  oxyCODONE-acetaminophen (PERCOCET) 5-325 MG tablet Take 1 tablet by mouth every 4 (four) hours as needed for severe pain. 09/22/21 09/22/22  Marvis Moeller, NP  PEG-KCl-NaCl-NaSulf-Na Asc-C (PLENVU) 140 g SOLR Take 1 kit by mouth as directed. 09/14/20   Esterwood, Amy S, PA-C  Propylene Glycol (SYSTANE COMPLETE) 0.6 % SOLN Place 1 drop into both eyes 2 (two) times daily as needed (dry eyes).    [provider]    Allergies    Cefdinir and Wellbutrin [bupropion]  Review of Systems   Review of Systems  Constitutional:  Negative for fever.  Respiratory:  Negative for shortness of breath.   Cardiovascular:  Negative for chest pain.  Gastrointestinal:  Positive for abdominal pain, constipation and nausea. Negative for vomiting.  Genitourinary:  Positive for dysuria.  Musculoskeletal:  Positive for back pain.  Skin:  Negative for rash.  Neurological:  Negative for headaches.   Physical Exam Updated Vital Signs BP 123/68    Pulse 87    Temp 97.9 F (36.6 C)    Resp 17    Ht 5' 5"  (1.651 m)    Wt 107.5 kg    SpO2 97%    BMI 39.44 kg/m   Physical Exam Vitals and nursing Sarah reviewed.  Constitutional:      General: She is not in acute distress.    Appearance: Normal appearance. She is not ill-appearing, toxic-appearing or diaphoretic.  HENT:     Head: Normocephalic.   Eyes:     Conjunctiva/sclera: Conjunctivae normal.  Cardiovascular:     Rate and Rhythm: Normal rate and regular rhythm.     Pulses: Normal pulses.  Pulmonary:     Effort: Pulmonary effort is normal. No respiratory distress.  Abdominal:     Palpations: Abdomen is soft.     Tenderness: There is abdominal tenderness (mild).  Musculoskeletal:        General: No deformity or signs of injury.  Cervical back: No rigidity.  Skin:    General: Skin is warm and dry.     Coloration: Skin is not jaundiced or pale.  Neurological:     General: No focal deficit present.     Mental Status: She is alert and oriented to person, place, and time.    ED Results / Procedures / Treatments   Labs (all labs ordered are listed, but only abnormal results are displayed) Labs Reviewed  COMPREHENSIVE METABOLIC PANEL - Abnormal; Notable for the following components:      Result Value   Sodium 134 (*)    Chloride 97 (*)    Glucose, Bld 114 (*)    Calcium 8.8 (*)    Total Protein 6.3 (*)    Albumin 3.2 (*)    All other components within normal limits  CBC WITH DIFFERENTIAL/PLATELET - Abnormal; Notable for the following components:   WBC 11.7 (*)    Platelets 436 (*)    Neutro Abs 9.7 (*)    All other components within normal limits    EKG None  Radiology DG Abdomen 1 View  Result Date: 09/27/2021 CLINICAL DATA:  Constipation, recent lumbar spine surgery EXAM: ABDOMEN - 1 VIEW COMPARISON:  None. FINDINGS: Moderate amount of stool is seen in the colon. There is no fecal impaction in the rectosigmoid. Bowel gas pattern is nonspecific. Kidneys are partly obscured by bowel contents. Round calcifications in the pelvis may be vascular. Degenerative changes are noted in the lumbar spine. There is evidence of laminectomy from L3 to L4 levels. There is mottled appearance in the soft tissues in the right paraspinal region at L3-L4 level which may be part of recent surgery. IMPRESSION: Nonspecific bowel gas  pattern. Lumbar spondylosis. Postsurgical changes are noted at L3-L4 level. Electronically Signed   By: Elmer Picker M.D.   On: 09/27/2021 18:33    Procedures Fecal disimpaction  Date/Time: 09/28/2021 11:06 PM Performed by: Gareth Morgan, MD Authorized by: Gareth Morgan, MD  Consent: Verbal consent obtained. Risks and benefits: risks, benefits and alternatives were discussed Consent given by: patient Required items: required blood products, implants, devices, and special equipment available Patient identity confirmed: verbally with patient Local anesthesia used: yes  Anesthesia: Local anesthesia used: yes Local Anesthetic: lidocaine 2% without epinephrine     Medications Ordered in ED Medications  oxyCODONE-acetaminophen (PERCOCET/ROXICET) 5-325 MG per tablet 1 tablet (1 tablet Oral Given 09/28/21 0013)  lidocaine (XYLOCAINE) 2 % jelly 1 application (1 application Topical Given 09/28/21 0020)  sodium phosphate (FLEET) 7-19 GM/118ML enema 1 enema (1 enema Rectal Given 09/28/21 0300)  methocarbamol (ROBAXIN) tablet 1,000 mg (500 mg Oral Given 09/28/21 0435)    ED Course  I have reviewed the triage vital signs and the nursing notes.  Pertinent labs & imaging results that were available during my care of the patient were reviewed by me and considered in my medical decision making (see chart for details).    MDM Rules/Calculators/A&P                         [phone] female with history of recent laminectomy/fusion 12/9 with Dr. Ronnald Ramp who presents with concern for constipation. She is tolerating po without vomiting and doubt obstruction, diverticulitis, or other acute intraabdominal pathology. Suspect constipation related to recent surgery and medications.  Disimpaction performed followed by enema with pt having BM.  No abdominal tenderness on reevaluation after BM.  Discussed importance of hydration, diet and recommended miralax taper (  4 caps in 1 32oz bottle day 1), senokot  and colace. Patient discharged in stable condition with understanding of reasons to return.        Final Clinical Impression(s) / ED Diagnoses Final diagnoses:  Drug-induced constipation    Rx / DC Orders ED Discharge Orders          Ordered    lidocaine (XYLOCAINE) 2 % jelly  3 times daily PRN        09/28/21 0453    senna-docusate (SENOKOT-S) 8.6-50 MG tablet  2 times daily        09/28/21 0453             Gareth Morgan, MD 09/28/21 2306

## 2021-09-27 NOTE — ED Provider Notes (Signed)
[place]    CSN: [id number] Arrival date & time: 09/27/21  1633      History   Chief Complaint Chief Complaint  Patient presents with   Constipation    HPI Sarah Spencer is a 65 y.o. female.   Patient presents with constipation, endorses that she has not had a complete bowel movement in greater than 7 days.  Endorses that she was able to expel a small hard lump 3 to 4 days ago.  Had a laminectomy on 09/21/2021 and has been taking oxycodone every 4 hours since to manage her pain.  Endorses nausea which she has been treating with Zofran and has not been helpful.  Use of stool softener, milk of magnesia, a hemorrhoid suppository and increase fluid intake with no relief.  Requesting relief and attempt at expulsion of bowel today.  Past Medical History:  Diagnosis Date   Allergies    Anxiety    Arthritis    Bronchitis    Depression    Family history of adverse reaction to anesthesia    sister-extreme N&V   GERD (gastroesophageal reflux disease)    Hyperlipidemia    Obesity    Osteoporosis    PONV (postoperative nausea and vomiting)    Vitamin D deficiency     Patient Active Problem List   Diagnosis Date Noted   S/P lumbar laminectomy 09/21/2021    Past Surgical History:  Procedure Laterality Date   CERVICAL DISC SURGERY     CESAREAN SECTION     x 1   COLONOSCOPY     ESOPHAGOGASTRODUODENOSCOPY     LAMINECTOMY WITH POSTERIOR LATERAL ARTHRODESIS LEVEL 3 Bilateral 09/21/2021   Procedure: Laminectomy and Foraminotomy Lumbar Two - Lumbar Three, Lumbar Three - Lumbar Four, Lumbar Four - Lumbar Five - Bilateral, Posterolateral Fusion Lumbar Two - Lumbar Three, Lumbar Three - Lumbar Four, Lumbar Four - Lumbar Five;  Surgeon: Eustace Moore, MD;  Location: [facility];  Service: Neurosurgery;  Laterality: Bilateral;   laparascopy     laporoscopy     TONSILLECTOMY      OB History   No obstetric history on file.      Home Medications    Prior to Admission  medications   Medication Sig Start Date End Date Taking? Authorizing Provider  atorvastatin (LIPITOR) 20 MG tablet Take 20 mg by mouth at bedtime.   Yes [provider]  citalopram (CELEXA) 20 MG tablet Take 20 mg by mouth daily.   Yes [provider]  methocarbamol (ROBAXIN) 500 MG tablet Take 1 tablet (500 mg total) by mouth 4 (four) times daily. 09/22/21  Yes Marvis Moeller, NP  oxyCODONE-acetaminophen (PERCOCET) 5-325 MG tablet Take 1 tablet by mouth every 4 (four) hours as needed for severe pain. 09/22/21 09/22/22 Yes Marvis Moeller, NP  albuterol (VENTOLIN HFA) 108 (90 Base) MCG/ACT inhaler Inhale 2 puffs into the lungs every 6 (six) hours as needed. 02/11/21   Hughie Closs, PA-C  Cholecalciferol (VITAMIN D3) 50 MCG (2000 UT) TABS Take 2,000 Units by mouth daily.    [provider]  clonazePAM (KLONOPIN) 0.5 MG tablet Take 0.25-0.5 mg by mouth 3 (three) times daily as needed for anxiety. 07/26/20   [provider]  Coenzyme Q10 (COQ10) 100 MG CAPS Take 100 mg by mouth daily.    [provider]  diclofenac (VOLTAREN) 75 MG EC tablet Take 75 mg by mouth daily. 07/06/20   [provider]  famotidine (PEPCID) 20  MG tablet Take 20 mg by mouth 2 (two) times daily.    [provider]  fluticasone (FLONASE) 50 MCG/ACT nasal spray Place 2 sprays into both nostrils daily. Patient not taking: Reported on 09/13/2021 02/11/21   Hughie Closs, PA-C  hyoscyamine (LEVSIN SL) 0.125 MG SL tablet Place 1 tablet (0.125 mg total) under the tongue every 4 (four) hours as needed (Abdominal pain and spasms). 09/14/20   Esterwood, Amy S, PA-C  montelukast (SINGULAIR) 10 MG tablet Take 10 mg by mouth at bedtime. 08/15/20   [provider]  niacin 500 MG tablet Take 500 mg by mouth at bedtime.    [provider]  PEG-KCl-NaCl-NaSulf-Na Asc-C (PLENVU) 140 g SOLR Take 1 kit by mouth as directed. 09/14/20   Esterwood, Amy S, PA-C   Propylene Glycol (SYSTANE COMPLETE) 0.6 % SOLN Place 1 drop into both eyes 2 (two) times daily as needed (dry eyes).    [provider]    Family History Family History  Problem Relation Age of Onset   Heart disease Father    Heart disease Sister    Other Brother        step brother   Heart disease Sister    Stroke Sister        caused by medicine   Colon cancer Neg Hx    Esophageal cancer Neg Hx    Rectal cancer Neg Hx     Social History Social History   Tobacco Use   Smoking status: Never   Smokeless tobacco: Never  Vaping Use   Vaping Use: Never used  Substance Use Topics   Alcohol use: No   Drug use: No     Allergies   Cefdinir and Wellbutrin [bupropion]   Review of Systems Review of Systems  Gastrointestinal:  Positive for constipation.    Physical Exam Triage Vital Signs ED Triage Vitals  Enc Vitals Group     BP 09/27/21 1650 133/75     Pulse Rate 09/27/21 1650 (!) 123     Resp 09/27/21 1650 20     Temp 09/27/21 1650 98.4 F (36.9 C)     Temp Source 09/27/21 1650 Oral     SpO2 09/27/21 1650 99 %     Weight --      Height --      Head Circumference --      Peak Flow --      Pain Score 09/27/21 1653 0     Pain Loc --      Pain Edu? --      Excl. in [place]? --    No data found.  Updated Vital Signs BP 133/75 (BP Location: Right Arm)    Pulse (!) 123    Temp 98.4 F (36.9 C) (Oral)    Resp 20    SpO2 99%   Visual Acuity Right Eye Distance:   Left Eye Distance:   Bilateral Distance:    Right Eye Near:   Left Eye Near:    Bilateral Near:     Physical Exam   UC Treatments / Results  Labs (all labs ordered are listed, but only abnormal results are displayed) Labs Reviewed - No data to display  EKG   Radiology No results found.  Procedures Procedures (including critical care time)  Medications Ordered in UC Medications - No data to display  Initial Impression / Assessment and Plan / UC Course  I have reviewed the  triage vital signs and the nursing notes.  Pertinent labs & imaging results that were available during my care of the patient were reviewed by me and considered in my medical decision making (see chart for details).  Constipation  Patient sent to the emergency department for further evaluation of constipation, requesting relief today, discussed capabilities of urgent care and that we would only be able to send patient home with medication, verbalized understanding, stable at this time, will be escorted to the emergency department by family Final Clinical Impressions(s) / UC Diagnoses   Final diagnoses:  Constipation, unspecified constipation type   Discharge Instructions   None    ED Prescriptions   None    PDMP not reviewed this encounter.   Hans Eden, NP 09/27/21 1719

## 2021-09-27 NOTE — ED Triage Notes (Signed)
Pt here with reports of constipation since spinal surgery on 12/9.  Pt states she has not had a bowel movement since then. Pt tried otc remedies without relief.

## 2021-09-27 NOTE — ED Triage Notes (Signed)
Patient c/o constipation x 1 week.   Patient endorses having spinal surgery last Friday.   Patient endorses nausea at times.   Patient has taken oxycodone for pain due to surgery.   Patient has used stool softener, Milk of Mag, nausea medication, hemorrhoid suppository, and fluids with no relief of symptoms.

## 2021-09-27 NOTE — ED Provider Notes (Signed)
Emergency Medicine Provider Triage Evaluation Note  BARBAR BREDE , a 65 y.o. female  was evaluated in triage.  Pt complains of constipation.  She reports that she doesn't know when her last bowel movement was.  She has been taking muscle relaxer's and percocet.  She was unable to find magnesium citrate.  She took milk of magnesia, with out relief.  She hasn't had a bm since the surgery.    She denies any persistent urinary incontinence.    She hasn't tried an enema or hasn't tried any miralax.     Review of Systems  Positive: See above.  Negative:   Physical Exam  There were no vitals taken for this visit. Gen:   Awake, no distress   Resp:  Normal effort  MSK:   Moves extremities without difficulty  Other:  Able to ambulate with walker.   Medical Decision Making  Medically screening exam initiated at 5:37 PM.  Appropriate orders placed.  CHERY GIUSTO was informed that the remainder of the evaluation will be completed by another provider, this initial triage assessment does not replace that evaluation, and the importance of remaining in the ED until their evaluation is complete.  Patient on narcotics with post operative constipation.  No BM since surgery.  She wants the  stool removed.  She doesn't have any urinary incontinence.    Will check kub and labs.    Cristina Gong, [place] 09/27/21 1743    Terald Sleeper, MD 09/27/21 [phone]

## 2021-09-27 NOTE — ED Notes (Signed)
Patient is being discharged from the Urgent Care and sent to the Emergency Department via POV . Per White,NP, patient is in need of higher level of care due to constipation x1wk. Patient is aware and verbalizes understanding of plan of care.  Vitals:   09/27/21 1650  BP: 133/75  Pulse: (!) 123  Resp: 20  Temp: 98.4 F (36.9 C)  SpO2: 99%

## 2021-09-28 MED ORDER — FLEET ENEMA 7-19 GM/118ML RE ENEM
1.0000 | ENEMA | Freq: Once | RECTAL | Status: AC
  Administered 2021-09-28: 1 via RECTAL
  Filled 2021-09-28: qty 1

## 2021-09-28 MED ORDER — METHOCARBAMOL 500 MG PO TABS
1000.0000 mg | ORAL_TABLET | Freq: Once | ORAL | Status: AC
  Administered 2021-09-28: 500 mg via ORAL
  Filled 2021-09-28: qty 2

## 2021-09-28 MED ORDER — LIDOCAINE HCL URETHRAL/MUCOSAL 2 % EX GEL: 1.0000 "application " | Freq: Three times a day (TID) | CUTANEOUS | 0 refills | Status: AC | PRN

## 2021-09-28 MED ORDER — SENNOSIDES-DOCUSATE SODIUM 8.6-50 MG PO TABS: 2.0000 | ORAL_TABLET | Freq: Two times a day (BID) | ORAL | 0 refills | Status: AC

## 2021-09-28 NOTE — Discharge Instructions (Addendum)
It was a pleasure caring for you today! If you have continued severe constipation, you may take up to 4 caps of miralax in one 32oz bottle of gatorade (or similar), then take 3 caps then next day, and 2 caps the next day or return to one cap per day.  Because you had a bowel movement tonight, I think it is reasonable to do one cap of miralax tomorrow in addition to your stool softener or the medication prescribed, and if you are not having regular stool tomorrow may increase to 2 caps the next day.  We hope you feel better! If you develop worsening pain, nausea, vomiting or other concerns return to the ED.

## 2021-09-28 NOTE — ED Notes (Signed)
Patient states she only takes 500 mg of methocarbamol

## 2021-10-14 HISTORY — PX: COLONOSCOPY: SHX174

## 2022-02-25 ENCOUNTER — Other Ambulatory Visit: Payer: Self-pay | Admitting: Family Medicine

## 2022-02-25 DIAGNOSIS — E348 Other specified endocrine disorders: Secondary | ICD-10-CM

## 2022-04-11 ENCOUNTER — Other Ambulatory Visit: Payer: Self-pay | Admitting: Obstetrics and Gynecology

## 2022-04-11 DIAGNOSIS — R9389 Abnormal findings on diagnostic imaging of other specified body structures: Secondary | ICD-10-CM

## 2022-04-25 ENCOUNTER — Ambulatory Visit
Admission: RE | Admit: 2022-04-25 | Discharge: 2022-04-25 | Disposition: A | Discharge status: Home / Self Care | Payer: Medicare Other | Source: Ambulatory Visit | Attending: Obstetrics and Gynecology | Admitting: Obstetrics and Gynecology

## 2022-04-25 DIAGNOSIS — R9389 Abnormal findings on diagnostic imaging of other specified body structures: Secondary | ICD-10-CM

## 2022-04-25 MED ORDER — GADOBENATE DIMEGLUMINE 529 MG/ML IV SOLN
20.0000 mL | Freq: Once | INTRAVENOUS | Status: AC | PRN
  Administered 2022-04-25: 20 mL via INTRAVENOUS

## 2022-08-13 ENCOUNTER — Ambulatory Visit
Admission: RE | Admit: 2022-08-13 | Discharge: 2022-08-13 | Disposition: A | Discharge status: Home / Self Care | Payer: Medicare Other | Source: Ambulatory Visit | Attending: Family Medicine | Admitting: Family Medicine

## 2022-08-13 ENCOUNTER — Other Ambulatory Visit: Payer: Self-pay | Admitting: Family Medicine

## 2022-08-13 DIAGNOSIS — Z1231 Encounter for screening mammogram for malignant neoplasm of breast: Secondary | ICD-10-CM

## 2022-08-13 DIAGNOSIS — E348 Other specified endocrine disorders: Secondary | ICD-10-CM

## 2022-12-02 ENCOUNTER — Encounter (HOSPITAL_COMMUNITY): Payer: Self-pay

## 2022-12-02 ENCOUNTER — Ambulatory Visit (HOSPITAL_COMMUNITY)
Admission: EM | Admit: 2022-12-02 | Discharge: 2022-12-02 | Disposition: A | Discharge status: Home / Self Care | Payer: Medicare Other | Attending: Physician Assistant | Admitting: Physician Assistant

## 2022-12-02 DIAGNOSIS — B9789 Other viral agents as the cause of diseases classified elsewhere: Secondary | ICD-10-CM | POA: Diagnosis not present

## 2022-12-02 DIAGNOSIS — J019 Acute sinusitis, unspecified: Secondary | ICD-10-CM | POA: Diagnosis not present

## 2022-12-02 MED ORDER — PREDNISONE 20 MG PO TABS: 40.0000 mg | ORAL_TABLET | Freq: Every day | ORAL | 0 refills | Status: AC

## 2022-12-02 NOTE — ED Triage Notes (Signed)
Patient c/o nasal congestion, right ear pain, and right sinus pan and pressure x 4 days.  Flonase and Zyrtec. Patient did not take any of these meds today.

## 2022-12-02 NOTE — Discharge Instructions (Signed)
Start prednisone 40 mg for 5 days.  Do not take NSAIDs with this medication including aspirin, ibuprofen/Advil, naproxen/Aleve.  I recommend over-the-counter medication including Mucinex, Flonase, Tylenol.  You should also use nasal saline/sinus rinses for symptom relief.  If your symptoms or not improving within a week return for reevaluation.  If you have any worsening symptoms including cough, high fever, shortness of breath, chest pain you should be seen immediately.

## 2022-12-02 NOTE — ED Provider Notes (Signed)
[facility]    CSN: [id number] Arrival date & time: 12/02/22  1357      History   Chief Complaint Chief Complaint  Patient presents with   Nasal Congestion   Otalgia    HPI Sarah Spencer is a 67 y.o. female.   Patient presents today with a 5-day history of URI symptoms including congestion, otalgia, sinus pressure and pain.  She reports a mild cough but denies any additional symptoms including fever, chest pain, shortness of breath, nausea, vomiting.  Denies any known sick contacts but does attend church where several people have been coughing.  She does have seasonal allergies and has been using cetirizine and fluticasone nasal spray with minimal improvement of symptoms; reports that last night her fluticasone caused worsening right-sided sinus pressure and pain.  She denies any recent antibiotics or steroids.  She has not had COVID in the past.  She has had COVID-19 vaccinations.  She does not smoke and denies history of asthma or COPD.  She is having difficulty with daily activities including sleeping at night as result of symptoms.    Past Medical History:  Diagnosis Date   Allergies    Anxiety    Arthritis    Bronchitis    Depression    Family history of adverse reaction to anesthesia    sister-extreme N&V   GERD (gastroesophageal reflux disease)    Hyperlipidemia    Obesity    Osteoporosis    PONV (postoperative nausea and vomiting)    Vitamin D deficiency     Patient Active Problem List   Diagnosis Date Noted   S/P lumbar laminectomy 09/21/2021    Past Surgical History:  Procedure Laterality Date   CERVICAL DISC SURGERY     CESAREAN SECTION     x 1   COLONOSCOPY     ESOPHAGOGASTRODUODENOSCOPY     LAMINECTOMY WITH POSTERIOR LATERAL ARTHRODESIS LEVEL 3 Bilateral 09/21/2021   Procedure: Laminectomy and Foraminotomy Lumbar Two - Lumbar Three, Lumbar Three - Lumbar Four, Lumbar Four - Lumbar Five - Bilateral, Posterolateral Fusion Lumbar Two -  Lumbar Three, Lumbar Three - Lumbar Four, Lumbar Four - Lumbar Five;  Surgeon: Eustace Moore, MD;  Location: [facility];  Service: Neurosurgery;  Laterality: Bilateral;   laparascopy     laporoscopy     TONSILLECTOMY      OB History   No obstetric history on file.      Home Medications    Prior to Admission medications   Medication Sig Start Date End Date Taking? Authorizing Provider  predniSONE (DELTASONE) 20 MG tablet Take 2 tablets (40 mg total) by mouth daily for 5 days. 12/02/22 12/07/22 Yes Carrigan Delafuente K, PA-C  albuterol (VENTOLIN HFA) 108 (90 Base) MCG/ACT inhaler Inhale 2 puffs into the lungs every 6 (six) hours as needed. 02/11/21   Hughie Closs, PA-C  atorvastatin (LIPITOR) 20 MG tablet Take 20 mg by mouth at bedtime.    [provider]  Cholecalciferol (VITAMIN D3) 50 MCG (2000 UT) TABS Take 2,000 Units by mouth daily.    [provider]  citalopram (CELEXA) 20 MG tablet Take 20 mg by mouth daily.    [provider]  clonazePAM (KLONOPIN) 0.5 MG tablet Take 0.25-0.5 mg by mouth 3 (three) times daily as needed for anxiety. 07/26/20   [provider]  Coenzyme Q10 (COQ10) 100 MG CAPS Take 100 mg by mouth daily.    [provider]  diclofenac (VOLTAREN) 75 MG  EC tablet Take 75 mg by mouth daily. 07/06/20   [provider]  famotidine (PEPCID) 20 MG tablet Take 20 mg by mouth 2 (two) times daily.    [provider]  hyoscyamine (LEVSIN SL) 0.125 MG SL tablet Place 1 tablet (0.125 mg total) under the tongue every 4 (four) hours as needed (Abdominal pain and spasms). 09/14/20   Esterwood, Amy S, PA-C  montelukast (SINGULAIR) 10 MG tablet Take 10 mg by mouth at bedtime. 08/15/20   [provider]  niacin 500 MG tablet Take 500 mg by mouth at bedtime.    [provider]  PEG-KCl-NaCl-NaSulf-Na Asc-C (PLENVU) 140 g SOLR Take 1 kit by mouth as directed. 09/14/20   Esterwood, Amy S, PA-C  Propylene Glycol  (SYSTANE COMPLETE) 0.6 % SOLN Place 1 drop into both eyes 2 (two) times daily as needed (dry eyes).    [provider]    Family History Family History  Problem Relation Age of Onset   Heart disease Father    Heart disease Sister    Other Brother        step brother   Heart disease Sister    Stroke Sister        caused by medicine   Colon cancer Neg Hx    Esophageal cancer Neg Hx    Rectal cancer Neg Hx     Social History Social History   Tobacco Use   Smoking status: Never   Smokeless tobacco: Never  Vaping Use   Vaping Use: Never used  Substance Use Topics   Alcohol use: No   Drug use: No     Allergies   Cefdinir and Wellbutrin [bupropion]   Review of Systems Review of Systems  Constitutional:  Positive for activity change. Negative for appetite change, fatigue and fever.  HENT:  Positive for congestion, ear pain, postnasal drip and sinus pressure. Negative for sneezing and sore throat.   Respiratory:  Positive for cough. Negative for shortness of breath.   Cardiovascular:  Negative for chest pain.  Gastrointestinal:  Negative for abdominal pain, diarrhea, nausea and vomiting.  Neurological:  Negative for dizziness, light-headedness and headaches.     Physical Exam Triage Vital Signs ED Triage Vitals  Enc Vitals Group     BP 12/02/22 1602 (!) 151/93     Pulse Rate 12/02/22 1602 88     Resp 12/02/22 1602 18     Temp 12/02/22 1602 98.3 F (36.8 C)     Temp Source 12/02/22 1602 Oral     SpO2 12/02/22 1602 95 %     Weight --      Height --      Head Circumference --      Peak Flow --      Pain Score 12/02/22 1603 3     Pain Loc --      Pain Edu? --      Excl. in [place]? --    No data found.  Updated Vital Signs BP (!) 151/93 (BP Location: Left Arm)   Pulse 88   Temp 98.3 F (36.8 C) (Oral)   Resp 18   SpO2 95%   Visual Acuity Right Eye Distance:   Left Eye Distance:   Bilateral Distance:    Right Eye Near:   Left Eye Near:     Bilateral Near:     Physical Exam Vitals reviewed.  Constitutional:      General: She is awake. She is not in acute distress.  Appearance: Normal appearance. She is well-developed. She is not ill-appearing.     Comments: Very pleasant female in stated age in no acute distress sitting comfortably in exam room  HENT:     Head: Normocephalic and atraumatic.     Right Ear: Ear canal and external ear normal. A middle ear effusion is present. Tympanic membrane is not erythematous or bulging.     Left Ear: Tympanic membrane, ear canal and external ear normal. Tympanic membrane is not erythematous or bulging.     Nose:     Right Sinus: Maxillary sinus tenderness present. No frontal sinus tenderness.     Left Sinus: Maxillary sinus tenderness present. No frontal sinus tenderness.     Mouth/Throat:     Pharynx: Uvula midline. No oropharyngeal exudate or posterior oropharyngeal erythema.  Cardiovascular:     Rate and Rhythm: Normal rate and regular rhythm.     Heart sounds: Normal heart sounds, S1 normal and S2 normal. No murmur heard. Pulmonary:     Effort: Pulmonary effort is normal.     Breath sounds: Normal breath sounds. No wheezing, rhonchi or rales.     Comments: Clear to auscultation bilaterally Psychiatric:        Behavior: Behavior is cooperative.      UC Treatments / Results  Labs (all labs ordered are listed, but only abnormal results are displayed) Labs Reviewed - No data to display  EKG   Radiology No results found.  Procedures Procedures (including critical care time)  Medications Ordered in UC Medications - No data to display  Initial Impression / Assessment and Plan / UC Course  I have reviewed the triage vital signs and the nursing notes.  Pertinent labs & imaging results that were available during my care of the patient were reviewed by me and considered in my medical decision making (see chart for details).     Patient is well-appearing, afebrile,  nontoxic, nontachycardic.  Viral testing was deferred as she has been symptomatic for 5 days and this would not change management.  No evidence of acute infection on physical exam so we will defer antibiotics.  She was started on prednisone burst of 40 mg for 5 days to help manage her symptoms.  Recommended that she use over-the-counter medications including Mucinex, fluticasone, Tylenol.  Also encouraged her to use nasal saline/sinus rinses for symptom relief.  She was instructed not to take NSAIDs with prednisone due to risk of GI bleeding.  If her symptoms or not improving within a week she is to return we would consider antibiotic therapy.  Discussed that if she has any worsening symptoms including high fever, shortness of breath, worsening cough, chest pain, nausea/vomiting interfering with oral intake she should be seen immediately.  Strict return precautions given.  Work excuse note provided.  Final Clinical Impressions(s) / UC Diagnoses   Final diagnoses:  Acute viral sinusitis     Discharge Instructions      Start prednisone 40 mg for 5 days.  Do not take NSAIDs with this medication including aspirin, ibuprofen/Advil, naproxen/Aleve.  I recommend over-the-counter medication including Mucinex, Flonase, Tylenol.  You should also use nasal saline/sinus rinses for symptom relief.  If your symptoms or not improving within a week return for reevaluation.  If you have any worsening symptoms including cough, high fever, shortness of breath, chest pain you should be seen immediately.     ED Prescriptions     Medication Sig Dispense Auth. Provider   predniSONE (DELTASONE) 20 MG  tablet Take 2 tablets (40 mg total) by mouth daily for 5 days. 10 tablet Eddy Termine, Derry Skill, PA-C      PDMP not reviewed this encounter.   Terrilee Croak, PA-C 12/02/22 1626

## 2022-12-08 ENCOUNTER — Ambulatory Visit (HOSPITAL_COMMUNITY)
Admission: EM | Admit: 2022-12-08 | Discharge: 2022-12-08 | Disposition: A | Discharge status: Home / Self Care | Payer: Medicare Other | Attending: Family Medicine | Admitting: Family Medicine

## 2022-12-08 ENCOUNTER — Encounter (HOSPITAL_COMMUNITY): Payer: Self-pay

## 2022-12-08 DIAGNOSIS — J0100 Acute maxillary sinusitis, unspecified: Secondary | ICD-10-CM | POA: Diagnosis not present

## 2022-12-08 MED ORDER — METHYLPREDNISOLONE ACETATE 80 MG/ML IJ SUSP
80.0000 mg | Freq: Once | INTRAMUSCULAR | Status: AC
  Administered 2022-12-08: 80 mg via INTRAMUSCULAR

## 2022-12-08 MED ORDER — AMOXICILLIN-POT CLAVULANATE 875-125 MG PO TABS: 1.0000 | ORAL_TABLET | Freq: Two times a day (BID) | ORAL | 0 refills | Status: AC

## 2022-12-08 MED ORDER — METHYLPREDNISOLONE SODIUM SUCC 125 MG IJ SOLR
INTRAMUSCULAR | Status: AC
  Filled 2022-12-08: qty 2

## 2022-12-08 MED ORDER — METHYLPREDNISOLONE ACETATE 80 MG/ML IJ SUSP
INTRAMUSCULAR | Status: AC
  Filled 2022-12-08: qty 1

## 2022-12-08 NOTE — Discharge Instructions (Addendum)
Take amoxicillin-clavulanate 875 mg--1 tab twice daily with food for 7 days  You have been given an injection of methylprednisolone 80 mg, a steroid.  Keep using your saline nasal spray.

## 2022-12-08 NOTE — ED Triage Notes (Signed)
Patient reports that she was seen 6 days ago for nasal congestion. Patient states she was told to come back if not improving.  Patient also reports that she has green nasal congestion that is blood tinged.  Patient state she was taking Prednisone as prescribed.

## 2022-12-08 NOTE — ED Provider Notes (Signed)
[facility]    CSN: [id number] Arrival date & time: 12/08/22  1104      History   Chief Complaint Chief Complaint  Patient presents with   Nasal Congestion    HPI Sarah Spencer is a 67 y.o. female.   HPI Here for continued nasal congestion.  She first got sick with URI symptoms 11 days ago.  She was seen here on February 19 and prescribed some prednisone.  It did help in the short-term a little bit for her respiratory symptoms and it did help her arthritis feel better.  She has not had any fever at any point.  She continues to have a lot of sinus pressure and nasal congestion.  She is coughing some.  No shortness of breath.  Cefdinir causes itching, but amoxicillin has never given her any problems.  Past Medical History:  Diagnosis Date   Allergies    Anxiety    Arthritis    Bronchitis    Depression    Family history of adverse reaction to anesthesia    sister-extreme N&V   GERD (gastroesophageal reflux disease)    Hyperlipidemia    Obesity    Osteoporosis    PONV (postoperative nausea and vomiting)    Vitamin D deficiency     Patient Active Problem List   Diagnosis Date Noted   S/P lumbar laminectomy 09/21/2021    Past Surgical History:  Procedure Laterality Date   CERVICAL DISC SURGERY     CESAREAN SECTION     x 1   COLONOSCOPY     ESOPHAGOGASTRODUODENOSCOPY     LAMINECTOMY WITH POSTERIOR LATERAL ARTHRODESIS LEVEL 3 Bilateral 09/21/2021   Procedure: Laminectomy and Foraminotomy Lumbar Two - Lumbar Three, Lumbar Three - Lumbar Four, Lumbar Four - Lumbar Five - Bilateral, Posterolateral Fusion Lumbar Two - Lumbar Three, Lumbar Three - Lumbar Four, Lumbar Four - Lumbar Five;  Surgeon: Eustace Moore, MD;  Location: [facility];  Service: Neurosurgery;  Laterality: Bilateral;   laparascopy     laporoscopy     TONSILLECTOMY      OB History   No obstetric history on file.      Home Medications    Prior to Admission medications   Medication Sig  Start Date End Date Taking? Authorizing Provider  amoxicillin-clavulanate (AUGMENTIN) 875-125 MG tablet Take 1 tablet by mouth 2 (two) times daily for 7 days. 12/08/22 12/15/22 Yes Min Tunnell, Gwenlyn Perking, MD  albuterol (VENTOLIN HFA) 108 (90 Base) MCG/ACT inhaler Inhale 2 puffs into the lungs every 6 (six) hours as needed. 02/11/21   Hughie Closs, PA-C  atorvastatin (LIPITOR) 20 MG tablet Take 20 mg by mouth at bedtime.    [provider]  Cholecalciferol (VITAMIN D3) 50 MCG (2000 UT) TABS Take 2,000 Units by mouth daily.    [provider]  citalopram (CELEXA) 20 MG tablet Take 20 mg by mouth daily.    [provider]  clonazePAM (KLONOPIN) 0.5 MG tablet Take 0.25-0.5 mg by mouth 3 (three) times daily as needed for anxiety. 07/26/20   [provider]  Coenzyme Q10 (COQ10) 100 MG CAPS Take 100 mg by mouth daily.    [provider]  diclofenac (VOLTAREN) 75 MG EC tablet Take 75 mg by mouth daily. 07/06/20   [provider]  famotidine (PEPCID) 20 MG tablet Take 20 mg by mouth 2 (two) times daily.    [provider]  hyoscyamine (LEVSIN SL) 0.125 MG SL tablet Place 1 tablet (  0.125 mg total) under the tongue every 4 (four) hours as needed (Abdominal pain and spasms). 09/14/20   Esterwood, Amy S, PA-C  montelukast (SINGULAIR) 10 MG tablet Take 10 mg by mouth at bedtime. 08/15/20   [provider]  niacin 500 MG tablet Take 500 mg by mouth at bedtime.    [provider]  PEG-KCl-NaCl-NaSulf-Na Asc-C (PLENVU) 140 g SOLR Take 1 kit by mouth as directed. 09/14/20   Esterwood, Amy S, PA-C  Propylene Glycol (SYSTANE COMPLETE) 0.6 % SOLN Place 1 drop into both eyes 2 (two) times daily as needed (dry eyes).    [provider]    Family History Family History  Problem Relation Age of Onset   Heart disease Father    Heart disease Sister    Other Brother        step brother   Heart disease Sister    Stroke Sister         caused by medicine   Colon cancer Neg Hx    Esophageal cancer Neg Hx    Rectal cancer Neg Hx     Social History Social History   Tobacco Use   Smoking status: Never   Smokeless tobacco: Never  Vaping Use   Vaping Use: Never used  Substance Use Topics   Alcohol use: No   Drug use: No     Allergies   Cefdinir and Wellbutrin [bupropion]   Review of Systems Review of Systems   Physical Exam Triage Vital Signs ED Triage Vitals [12/08/22 1236]  Enc Vitals Group     BP (!) 143/79     Pulse Rate 80     Resp 16     Temp 97.6 F (36.4 C)     Temp Source Oral     SpO2 93 %     Weight      Height      Head Circumference      Peak Flow      Pain Score 0     Pain Loc      Pain Edu?      Excl. in [place]?    No data found.  Updated Vital Signs BP (!) 143/79 (BP Location: Right Arm)   Pulse 80   Temp 97.6 F (36.4 C) (Oral)   Resp 16   SpO2 93%   Visual Acuity Right Eye Distance:   Left Eye Distance:   Bilateral Distance:    Right Eye Near:   Left Eye Near:    Bilateral Near:     Physical Exam Vitals reviewed.  Constitutional:      General: She is not in acute distress.    Appearance: She is not toxic-appearing.  HENT:     Right Ear: Tympanic membrane and ear canal normal.     Left Ear: Tympanic membrane and ear canal normal.     Nose: Nose normal.     Mouth/Throat:     Mouth: Mucous membranes are moist.     Pharynx: No oropharyngeal exudate or posterior oropharyngeal erythema.  Eyes:     Extraocular Movements: Extraocular movements intact.     Conjunctiva/sclera: Conjunctivae normal.     Pupils: Pupils are equal, round, and reactive to light.  Cardiovascular:     Rate and Rhythm: Normal rate and regular rhythm.     Heart sounds: No murmur heard. Pulmonary:     Effort: Pulmonary effort is normal. No respiratory distress.     Breath sounds: No stridor. No wheezing,  rhonchi or rales.  Musculoskeletal:     Cervical back: Neck supple.   Lymphadenopathy:     Cervical: No cervical adenopathy.  Skin:    Capillary Refill: Capillary refill takes less than 2 seconds.     Coloration: Skin is not jaundiced or pale.  Neurological:     General: No focal deficit present.     Mental Status: She is alert and oriented to person, place, and time.  Psychiatric:        Behavior: Behavior normal.      UC Treatments / Results  Labs (all labs ordered are listed, but only abnormal results are displayed) Labs Reviewed - No data to display  EKG   Radiology No results found.  Procedures Procedures (including critical care time)  Medications Ordered in UC Medications  methylPREDNISolone acetate (DEPO-MEDROL) injection 80 mg (has no administration in time range)    Initial Impression / Assessment and Plan / UC Course  I have reviewed the triage vital signs and the nursing notes.  Pertinent labs & imaging results that were available during my care of the patient were reviewed by me and considered in my medical decision making (see chart for details).     I am going to prescribe Augmenti for acute sinusitis, as her symptoms have been going on for over 10 days now.  Injection of Depo-Medrol was given here today. Final Clinical Impressions(s) / UC Diagnoses   Final diagnoses:  Acute maxillary sinusitis, recurrence not specified     Discharge Instructions      Take amoxicillin-clavulanate 875 mg--1 tab twice daily with food for 7 days  You have been given an injection of methylprednisolone 80 mg, a steroid.  Keep using your saline nasal spray.     ED Prescriptions     Medication Sig Dispense Auth. Provider   amoxicillin-clavulanate (AUGMENTIN) 875-125 MG tablet Take 1 tablet by mouth 2 (two) times daily for 7 days. 14 tablet Pranish Akhavan, Gwenlyn Perking, MD      I have reviewed the PDMP during this encounter.   Barrett Henle, MD 12/08/22 1248

## 2022-12-10 ENCOUNTER — Encounter (HOSPITAL_COMMUNITY): Payer: Self-pay | Admitting: Anesthesiology

## 2022-12-10 ENCOUNTER — Encounter (HOSPITAL_BASED_OUTPATIENT_CLINIC_OR_DEPARTMENT_OTHER): Payer: Self-pay | Admitting: Obstetrics and Gynecology

## 2022-12-10 NOTE — Progress Notes (Signed)
Patient began symptoms of an upper respiratory infection on 11/27/22. She visited Urgent Care on 12/02/22 and was treated with prednisone. On 12/08/22 she went back to the Urgent Care and was treated for acute maxillary sinusitis with antibiotics and prednisone. I spoke with patient today, 12/10/22 and she is still symptomatic. I reviewed case with Dr. Lanetta Inch, MDA. Per Dr. Lanetta Inch, patient can be rescheduled 2 weeks after her symptoms resolve. Also, she needs to get cardiac clearance prior to surgery. I called Stanton Kidney, scheduler at Dr. Delanna Ahmadi office and left her a message with the above information and asked her to call me back.

## 2022-12-10 NOTE — H&P (Signed)
NAME: Sarah, Spencer MEDICAL RECORD NO: [id number] ACCOUNT NO: [id number] DATE OF BIRTH: 02-13-1956 PHYSICIAN: Ralene Bathe. Matthew Saras, MD  History and Physical   DATE OF ADMISSION: 12/23/2022  Upcoming surgery at [facility], LAVH BSO.  CHIEF COMPLAINT:  Postmenopausal bleeding.  HISTORY OF PRESENT ILLNESS:  67 year old G2 P2 widowed postmenopausal patient seen originally by me on 04/23.  She presented at that time with some postmenopausal bleeding.  We tried 06/23 to do outpatient sonohysterogram, but due to severe cervical  stenosis could not accomplish that.  In the office under sedation could never get significant enough dilatation to pass the hysteroscope.  We did get a small [place] sample dated 04/05/2022 that showed fragments of ectocervix possible lower uterine  segment, suggest resampling.  She continued to have some postmenopausal bleeding, but further evaluation including ultrasound and ultimately pelvic MRI 07/23.  The MRI showed normal findings.  The endometrium appeared to be 2 mm.  Despite that finding, she had some continued spotting and due to the inability to really do sampling due to the stenotic situation, she would prefer definitive LAVH BSO, which we discussed in detail.  Specifically, risks related to bleeding, infection,  transfusion, the possible need to complete the surgery by open technique, adjacent organ injury along with her expected recovery time reviewed, which she understands and accepts.  PAST MEDICAL HISTORY: ALLERGIES:  Cefdinir causes moderate to severe itching.  CURRENT MEDICATIONS:  Albuterol p.r.n., atorvastatin 20 mg daily, Celebrex b.i.d. p.r.n., citalopram 20 mg daily, clonazepam 0.5 p.r.n., diclofenac b.i.d. as needed, Pepcid 20 mg once daily, fluticasone nasal spray, Singulair 10 mg once daily along with  vitamin D3.  She is a G2 P2,  FAMILY HISTORY:  Significant for father, sister with heart disease.  Sister who has had a CVA and renal  failure.  SOCIAL HISTORY:  She is widowed.  Never smoker.  Denies alcohol use.  PAST SURGICAL HISTORY:  She has had a C-section, tonsillectomy, laminectomy and cataract surgery.  PHYSICAL EXAMINATION:   VITAL SIGNS:  Temperature 98.2, blood pressure 120/62. HEENT:  Unremarkable. NECK:  Supple, without masses. CARDIOVASCULAR:  Regular rate and rhythm without murmurs, rubs or gallops. LUNGS:  Clear. BREASTS: Without masses, tenderness, or nipple discharge. ABDOMEN:  Soft, flat, nontender. PELVIC: Vulva, vagina, cervix normal.  Uterus mid position, mobile.  Adnexa negative. EXTREMITIES:  Unremarkable. NEUROLOGIC:  Unremarkable.  ASSESSMENT:  Postmenopausal bleeding, severe cervical stenosis, precluding, adequate endometrial sampling for LAVH BSO.  Procedure and risks discussed as above.   PUS D: 12/09/2022 3:09:02 pm T: 12/09/2022 6:21:00 pm  JOB: [id number]

## 2022-12-23 ENCOUNTER — Ambulatory Visit (HOSPITAL_BASED_OUTPATIENT_CLINIC_OR_DEPARTMENT_OTHER): Admit: 2022-12-23 | Payer: Medicare Other | Admitting: Obstetrics and Gynecology

## 2022-12-23 ENCOUNTER — Encounter (HOSPITAL_BASED_OUTPATIENT_CLINIC_OR_DEPARTMENT_OTHER): Payer: Self-pay

## 2022-12-23 DIAGNOSIS — N950 Postmenopausal bleeding: Secondary | ICD-10-CM

## 2022-12-23 HISTORY — DX: Mild intermittent asthma, uncomplicated: J45.20

## 2022-12-23 HISTORY — DX: Shortness of breath: R06.02

## 2022-12-23 HISTORY — DX: Localized edema: R60.0

## 2022-12-23 SURGERY — HYSTERECTOMY, VAGINAL, LAPAROSCOPY-ASSISTED, WITH SALPINGO-OOPHORECTOMY
Anesthesia: General | Laterality: Bilateral

## 2022-12-31 NOTE — Progress Notes (Signed)
CARDIOLOGY CONSULT NOTE       Patient ID: NEALA SHONTZ MRN: [id number] DOB/AGE: August 02, 1956 67 y.o.  Admit date: (Not on file) Referring Physician: Molli Posey MD  Primary Physician: [facility] (Inactive) Primary Cardiologist: New Reason for Consultation: Preoperative    HPI:  67 y.o. with post menopausal bleeding needing OB/GYN surgery with anesthesia. She has had issues with sinusitis and vaginal spotting since February Rx with antibiotics and steroids History of obesity, GERD, Depression/Anxiety and HLD Father had premature CAD and sister with CVA and renal failure   I see no history of vascular dx, CAD, Valve dx CHF or bleeding issues She has had anesthesia in past for laminectomy 09/21/21 with no issues  Seen by Dr Leanna Sato for dyspnea 07/19/21 TTE was normal EF > 55% mild TR She indicates her dyspnea was from prolonged bronchitis and resolved when this was better so she never f/u with cardiology  Recent sinusitis ear infection rx antibiotics Symptoms better Sunday Told she would have to wait 2 weeks from symptom offset to have surgery  She is widowed Two daughters one lives with her and one in [place] hysterectomy with Dr Matthew Saras for bleeding Retired from [employer] 6 mets no symptoms and no issues with prior back/neck surgery   ROS All other systems reviewed and negative except as noted above  Past Medical History:  Diagnosis Date   Allergies    Anxiety    Arthritis    Bronchitis    recurrent   Depression    Family history of adverse reaction to anesthesia    sister-extreme N&V   GERD (gastroesophageal reflux disease)    Hyperlipidemia    Mild intermittent reactive airway disease    Obesity    Osteoporosis    Pedal edema    PONV (postoperative nausea and vomiting)    SOBOE (shortness of breath on exertion)    Vitamin D deficiency     Family History  Problem Relation Age of Onset   Heart disease Father    Heart  disease Sister    Other Brother        step brother   Heart disease Sister    Stroke Sister        caused by medicine   Colon cancer Neg Hx    Esophageal cancer Neg Hx    Rectal cancer Neg Hx     Social History   Socioeconomic History   Marital status: Widowed    Spouse name: Not on file   Number of children: 4   Years of education: Not on file   Highest education level: Not on file  Occupational History   Occupation: retired  Tobacco Use   Smoking status: Never   Smokeless tobacco: Never  Vaping Use   Vaping Use: Never used  Substance and Sexual Activity   Alcohol use: No   Drug use: No   Sexual activity: Not Currently  Other Topics Concern   Not on file  Social History Narrative   Not on file   Social Determinants of Health   Financial Resource Strain: Not on file  Food Insecurity: Not on file  Transportation Needs: Not on file  Physical Activity: Not on file  Stress: Not on file  Social Connections: Not on file  Intimate Partner Violence: Not on file    Past Surgical History:  Procedure Laterality Date   [place]  x 1   COLONOSCOPY     ESOPHAGOGASTRODUODENOSCOPY     LAMINECTOMY WITH POSTERIOR LATERAL ARTHRODESIS LEVEL 3 Bilateral 09/21/2021   Procedure: Laminectomy and Foraminotomy Lumbar Two - Lumbar Three, Lumbar Three - Lumbar Four, Lumbar Four - Lumbar Five - Bilateral, Posterolateral Fusion Lumbar Two - Lumbar Three, Lumbar Three - Lumbar Four, Lumbar Four - Lumbar Five;  Surgeon: Eustace Moore, MD;  Location: [facility] OR;  Service: Neurosurgery;  Laterality: Bilateral;   laparascopy     laporoscopy     TONSILLECTOMY        Current Outpatient Medications:    albuterol (VENTOLIN HFA) 108 (90 Base) MCG/ACT inhaler, Inhale 2 puffs into the lungs every 6 (six) hours as needed., Disp: 18 g, Rfl: 0   atorvastatin (LIPITOR) 20 MG tablet, Take 20 mg by mouth at bedtime., Disp: , Rfl:    Cholecalciferol (VITAMIN D3) 50 MCG  (2000 UT) TABS, Take 2,000 Units by mouth daily., Disp: , Rfl:    citalopram (CELEXA) 20 MG tablet, Take 20 mg by mouth daily., Disp: , Rfl:    clonazePAM (KLONOPIN) 0.5 MG tablet, Take 0.25-0.5 mg by mouth 3 (three) times daily as needed for anxiety., Disp: , Rfl:    Coenzyme Q10 (COQ10) 100 MG CAPS, Take 100 mg by mouth daily., Disp: , Rfl:    diclofenac (VOLTAREN) 75 MG EC tablet, Take 75 mg by mouth daily., Disp: , Rfl:    famotidine (PEPCID) 20 MG tablet, Take 20 mg by mouth 2 (two) times daily., Disp: , Rfl:    hyoscyamine (LEVSIN SL) 0.125 MG SL tablet, Place 1 tablet (0.125 mg total) under the tongue every 4 (four) hours as needed (Abdominal pain and spasms)., Disp: 30 tablet, Rfl: 2   montelukast (SINGULAIR) 10 MG tablet, Take 10 mg by mouth at bedtime., Disp: , Rfl:    niacin 500 MG tablet, Take 500 mg by mouth at bedtime., Disp: , Rfl:    Propylene Glycol (SYSTANE COMPLETE) 0.6 % SOLN, Place 1 drop into both eyes 2 (two) times daily as needed (dry eyes)., Disp: , Rfl:     Physical Exam: There were no vitals taken for this visit.   Affect appropriate Healthy:  appears stated age 28: normal Neck supple with no adenopathy JVP normal no bruits no thyromegaly Lungs clear with no wheezing and good diaphragmatic motion Heart:  S1/S2 no murmur, no rub, gallop or click PMI normal Abdomen: benighn, BS positve, no tenderness, no AAA no bruit.  No HSM or HJR Distal pulses intact with no bruits No edema Neuro non-focal Skin warm and dry No muscular weakness   Labs:   Lab Results  Component Value Date   WBC 11.7 (H) 09/27/2021   HGB 12.1 09/27/2021   HCT 37.4 09/27/2021   MCV 88.4 09/27/2021   PLT 436 (H) 09/27/2021   No results for input(s): "NA", "K", "CL", "CO2", "BUN", "CREATININE", "CALCIUM", "PROT", "BILITOT", "ALKPHOS", "ALT", "AST", "GLUCOSE" in the last 168 hours.  Invalid input(s): "LABALBU" No results found for: "CKTOTAL", "CKMB", "CKMBINDEX", "TROPONINI" No  results found for: "CHOL" No results found for: "HDL" No results found for: "LDLCALC" No results found for: "TRIG" No results found for: "CHOLHDL" No results found for: "LDLDIRECT"    Radiology: No results found.  EKG: SR rate 76 poor R Wave progression    ASSESSMENT AND PLAN:   Preoperative:  Ok to proceed with OB/GYN hysteroscopy under anesthesia for post menopausal bleeding Prior echo normal ECG normal no symptoms Clear  to have hysterectomy with general anesthesia from cardiac perspective HLD:  continue statin given family history   Anxiety:  PRN klonopin f/u primary  Depression: Widowed continue Celexa  GERD:  low carb diet Pepcid  Clear to have hysterectomy with Dr Matthew Saras  F/U cardiology PRN   Signed: Jenkins Rouge 12/31/2022, 5:23 PM

## 2023-01-07 ENCOUNTER — Ambulatory Visit: Payer: Medicare Other | Attending: Cardiovascular Disease | Admitting: Cardiovascular Disease

## 2023-01-07 ENCOUNTER — Encounter: Payer: Self-pay | Admitting: Cardiovascular Disease

## 2023-01-07 VITALS — BP 132/86 | HR 76 | Ht 65.5 in | Wt 247.6 lb

## 2023-01-07 DIAGNOSIS — R0609 Other forms of dyspnea: Secondary | ICD-10-CM

## 2023-01-07 DIAGNOSIS — Z01818 Encounter for other preprocedural examination: Secondary | ICD-10-CM | POA: Diagnosis not present

## 2023-01-07 NOTE — Patient Instructions (Signed)
Medication Instructions:  Your physician recommends that you continue on your current medications as directed. Please refer to the Current Medication list given to you today.  *If you need a refill on your cardiac medications before your next appointment, please call your pharmacy*   Lab Work: If you have labs (blood work) drawn today and your tests are completely normal, you will receive your results only by: Cut Off (if you have MyChart) OR A paper copy in the mail If you have any lab test that is abnormal or we need to change your treatment, we will call you to review the results.  Follow-Up: At [facility], you and your health needs are our priority.  As part of our continuing mission to provide you with exceptional heart care, we have created designated Provider Care Teams.  These Care Teams include your primary Cardiologist (physician) and Advanced Practice Providers (APPs -  Physician Assistants and Nurse Practitioners) who all work together to provide you with the care you need, when you need it.  We recommend signing up for the patient portal called "MyChart".  Sign up information is provided on this After Visit Summary.  MyChart is used to connect with patients for Virtual Visits (Telemedicine).  Patients are able to view lab/test results, encounter notes, upcoming appointments, etc.  Non-urgent messages can be sent to your provider as well.   To learn more about what you can do with MyChart, go to [email or web address].    Your next appointment:   AS NEEDED  Provider:   DR. Johnsie Cancel

## 2023-02-07 ENCOUNTER — Encounter (HOSPITAL_BASED_OUTPATIENT_CLINIC_OR_DEPARTMENT_OTHER): Payer: Self-pay | Admitting: Obstetrics and Gynecology

## 2023-02-10 ENCOUNTER — Encounter (HOSPITAL_BASED_OUTPATIENT_CLINIC_OR_DEPARTMENT_OTHER): Payer: Self-pay | Admitting: Obstetrics and Gynecology

## 2023-02-11 ENCOUNTER — Encounter (HOSPITAL_BASED_OUTPATIENT_CLINIC_OR_DEPARTMENT_OTHER): Payer: Self-pay | Admitting: Obstetrics and Gynecology

## 2023-02-11 ENCOUNTER — Other Ambulatory Visit: Payer: Self-pay

## 2023-02-11 DIAGNOSIS — N950 Postmenopausal bleeding: Secondary | ICD-10-CM | POA: Diagnosis not present

## 2023-02-11 DIAGNOSIS — Z01812 Encounter for preprocedural laboratory examination: Secondary | ICD-10-CM | POA: Diagnosis present

## 2023-02-11 NOTE — Progress Notes (Signed)
Spoke w/ via phone for pre-op interview---[facility] needs dos---- none per anesthesia, surgeon orders pending as of 02/11/23              Lab results------02/28/23 lab appt for cbc, type & screen, 01/07/23 EKG in chart & Epic COVID test -----patient states asymptomatic no test needed Arrive at -------0530 on Monday, 03/03/23 NPO after MN NO Solid Food.  Clear liquids from MN until---0430 Med rec completed Medications to take morning of surgery -----Albuterol inhaler prn, Celexa, Clonazepam prn, Famotidine, Systane eye gtts prn Diabetic medication -----n/a Patient instructed no nail polish to be worn day of surgery Patient instructed to bring photo id and insurance card day of surgery Patient aware to have Driver (ride ) / caregiver    for 24 hours after surgery - daughter, Baxter Hire Patient Special Instructions -----Extended / overnight stay instructions given. Pre-Op special Instructions -----Requested orders from Dr. Marcelle Overlie via Epic IB on 02/07/23. Patient verbalized understanding of instructions that were given at this phone interview. Patient denies shortness of breath, chest pain, fever, cough at this phone interview.  Cardiac Clearance in chart dated 01/07/23 by Dr. Charlton Haws.

## 2023-02-11 NOTE — Progress Notes (Signed)
Your procedure is scheduled on Monday, 03/03/23.  Report to [facility] AT  5:30 AM.   Call this number if you have problems the morning of surgery  :[phone].   OUR ADDRESS IS [address].  WE ARE LOCATED IN [facility].  PLEASE BRING YOUR INSURANCE CARD AND PHOTO ID DAY OF SURGERY.  ONLY 2 PEOPLE ARE ALLOWED IN  WAITING  ROOM                                      REMEMBER:  DO NOT EAT FOOD, CANDY GUM OR MINTS  AFTER MIDNIGHT THE NIGHT BEFORE YOUR SURGERY . YOU MAY HAVE CLEAR LIQUIDS FROM MIDNIGHT THE NIGHT BEFORE YOUR SURGERY UNTIL  4:30 AM. NO CLEAR LIQUIDS AFTER   4:30 AM DAY OF SURGERY.  YOU MAY  BRUSH YOUR TEETH MORNING OF SURGERY AND RINSE YOUR MOUTH OUT, NO CHEWING GUM CANDY OR MINTS.     CLEAR LIQUID DIET    Allowed      Water                                                                   Coffee and tea, regular and decaf  (NO cream or milk products of any type, may sweeten)                         Carbonated beverages, regular and diet                                    Sports drinks like Gatorade _____________________________________________________________________     TAKE ONLY THESE MEDICATIONS MORNING OF SURGERY: Albuterol inhaler if needed, Celexa, Clonazepam if needed, Famotidine, Systane eye drops if needed                                        DO NOT WEAR JEWERLY/  METAL/  PIERCINGS (INCLUDING NO PLASTIC PIERCINGS) DO NOT WEAR LOTIONS, POWDERS, PERFUMES OR NAIL POLISH ON YOUR FINGERNAILS. TOENAIL POLISH IS OK TO WEAR. DO NOT SHAVE FOR 48 HOURS PRIOR TO DAY OF SURGERY.  CONTACTS, GLASSES, OR DENTURES MAY NOT BE WORN TO SURGERY.  REMEMBER: NO SMOKING, VAPING ,  DRUGS OR ALCOHOL FOR 24 HOURS BEFORE YOUR SURGERY.                                    [facility] [facility] IS NOT RESPONSIBLE  FOR ANY BELONGINGS.                                                                    [facility] - Preparing for  Surgery Before surgery, you can play an important role.  Because skin is not sterile, your skin needs to be as free of germs as possible.  You can reduce the number of germs on your skin by washing with CHG (chlorahexidine gluconate) soap before surgery.  CHG is an antiseptic cleaner which kills germs and bonds with the skin to continue killing germs even after washing. Please DO NOT use if you have an allergy to CHG or antibacterial soaps.  If your skin becomes reddened/irritated stop using the CHG and inform your nurse when you arrive at Short Stay. Do not shave (including legs and underarms) for at least 48 hours prior to the first CHG shower.  You may shave your face/neck. Please follow these instructions carefully:  1.  Shower with CHG Soap the night before surgery and the  morning of Surgery.  2.  If you choose to wash your hair, wash your hair first as usual with your  normal  shampoo.  3.  After you shampoo, rinse your hair and body thoroughly to remove the  shampoo.                                        4.  Use CHG as you would any other liquid soap.  You can apply chg directly  to the skin and wash , chg soap provided, night before and morning of your surgery.  5.  Apply the CHG Soap to your body ONLY FROM THE NECK DOWN.   Do not use on face/ open                           Wound or open sores. Avoid contact with eyes, ears mouth and genitals (private parts).                       Wash face,  Genitals (private parts) with your normal soap.             6.  Wash thoroughly, paying special attention to the area where your surgery  will be performed.  7.  Thoroughly rinse your body with warm water from the neck down.  8.  DO NOT shower/wash with your normal soap after using and rinsing off  the CHG Soap.             9.  Pat yourself dry with a clean towel.            10.  Wear clean pajamas.            11.  Place clean sheets on your bed the night of your first shower and do not  sleep with  pets. Day of Surgery : Do not apply any lotions/ powders the morning of surgery.  Please wear clean clothes to the hospital/surgery center.  IF YOU HAVE ANY SKIN IRRITATION OR PROBLEMS WITH THE SURGICAL SOAP, PLEASE GET A BAR OF GOLD DIAL SOAP AND SHOWER THE NIGHT BEFORE YOUR SURGERY AND THE MORNING OF YOUR SURGERY. PLEASE LET THE NURSE KNOW MORNING OF YOUR SURGERY IF YOU HAD ANY PROBLEMS WITH THE SURGICAL SOAP.   YOUR SURGEON MAY HAVE REQUESTED EXTENDED RECOVERY TIME AFTER YOUR SURGERY. IT COULD BE A  JUST A FEW HOURS  UP TO AN OVERNIGHT STAY.  YOUR SURGEON SHOULD HAVE  DISCUSSED THIS WITH YOU PRIOR TO YOUR SURGERY. IN THE EVENT YOU NEED TO STAY OVERNIGHT PLEASE REFER TO THE FOLLOWING GUIDELINES. YOU MAY HAVE UP TO 4 VISITORS  MAY VISIT IN THE EXTENDED RECOVERY ROOM UNTIL 800 PM ONLY.  ONE  VISITOR AGE 23 AND OVER MAY SPEND THE NIGHT AND MUST BE IN EXTENDED RECOVERY ROOM NO LATER THAN 800 PM . YOUR DISCHARGE TIME AFTER YOU SPEND THE NIGHT IS 900 AM THE MORNING AFTER YOUR SURGERY. YOU MAY PACK A SMALL OVERNIGHT BAG WITH TOILETRIES FOR YOUR OVERNIGHT STAY IF YOU WISH.  REGARDLESS OF IF YOU STAY OVER NIGHT OR ARE DISCHARGED THE SAME DAY YOU WILL BE REQUIRED TO HAVE A RESPONSIBLE ADULT (18 YRS OLD OR OLDER) STAY WITH YOU FOR AT LEAST THE FIRST 24 HOURS  YOUR PRESCRIPTION MEDICATIONS WILL BE PROVIDED DURING YOUR HOSPITAL STAY.  ________________________________________________________________________                                                        QUESTIONS Mechele Claude PRE OP NURSE PHONE [phone].

## 2023-02-18 NOTE — H&P (Unsigned)
NAME: Sarah Spencer, GENSCH MEDICAL RECORD NO: [id number] ACCOUNT NO: [id number] DATE OF BIRTH: 13-Apr-1956 PHYSICIAN: Duke Salvia. Marcelle Overlie, MD  History and Physical   DATE OF ADMISSION: 03/03/2023  DATE OF SCHEDULED SURGERY:  5/20 at [facility].  CHIEF COMPLAINT:  Postmenopausal bleeding.  HISTORY OF PRESENT ILLNESS:  A 67 year old widowed G2 P2, saw as a new patient one year ago with some complaints of postmenopausal bleeding.  Screening ultrasound showed the possibility of 2 small polyps, but due to severe cervical stenosis had problems  getting past the internal os for a good SHG.  We scheduled a time to do office D and C and hysteroscopy, but same problem was encountered.  I could not really get a good fundal specimen.  We did order 7/23 pelvic MRI to look at the endometrium in more detail.  This showed a 2 mm endometrium.  No  other abnormalities noted.  She has continued to have some postmenopausal spotting and prefers to have definitive hysterectomy with BSO.  This procedure including specific risks regarding bleeding, infection, adjacent organ injury, wound infection,  phlebitis, the possible need to complete the surgery by open technique, all reviewed with her, which she understands and accepts.  ALLERGIES: BUPROPION causes nausea and vomiting, CEFDINIR causes moderate to severe itching.  CURRENT MEDICATIONS:  Albuterol inhaler p.r.n., atorvastatin 20 mg daily, Celebrex b.i.d. p.r.n., citalopram 20 mg daily, clonazepam 0.5 p.r.n., Pepcid 20 mg p.r.n., fluticasone nasal spray, Singulair tablets once daily, vitamin D3.  OBSTETRICAL HISTORY: One C-section, one vaginal delivery.  FAMILY HISTORY:  Significant for heart disease in father and two sisters and a sister who has had a renal failure syndrome.  SOCIAL HISTORY:  She is widowed.  She is retired.  Denies tobacco use or alcohol use.  PAST SURGICAL HISTORY:  One cesarean, she has had neck surgery, lumbar laminectomy, tonsillectomy and  cataract surgery.  PHYSICAL EXAMINATION:   VITAL SIGNS:  Temperature 98.2, blood pressure 130/72. HEENT:  Unremarkable. NECK:  Supple, without masses. LUNGS:  Clear. CARDIOVASCULAR:  Regular rate and rhythm without murmurs, rubs or gallops. BREASTS: Without masses, tenderness, or nipple discharge. ABDOMEN:  Soft, flat, nontender. PELVIC:  Vulva, vagina, cervix normal.  Uterus mid position, normal size, mobile.  Adnexa negative. EXTREMITIES AND NEUROLOGIC:  Unremarkable.  IMPRESSION:  Postmenopausal bleeding, severe cervical stenosis precluding fundal evaluation as noted in her HPI.  PLAN:  LAVH BSO.  Procedure and risks discussed as above.   VAI D: 02/17/2023 4:06:15 pm T: 02/17/2023 11:57:00 pm  JOB: [id number]/ [id number]

## 2023-02-28 ENCOUNTER — Encounter (HOSPITAL_COMMUNITY)
Admission: RE | Admit: 2023-02-28 | Discharge: 2023-02-28 | Disposition: A | Discharge status: Home / Self Care | Payer: Medicare Other | Source: Ambulatory Visit | Attending: Obstetrics and Gynecology | Admitting: Obstetrics and Gynecology

## 2023-02-28 DIAGNOSIS — Z01812 Encounter for preprocedural laboratory examination: Secondary | ICD-10-CM | POA: Diagnosis not present

## 2023-02-28 DIAGNOSIS — N950 Postmenopausal bleeding: Secondary | ICD-10-CM | POA: Insufficient documentation

## 2023-02-28 LAB — CBC
HCT: 40.1 % (ref 36.0–46.0)
Hemoglobin: 12.8 g/dL (ref 12.0–15.0)
MCH: 28.9 pg (ref 26.0–34.0)
MCHC: 31.9 g/dL (ref 30.0–36.0)
MCV: 90.5 fL (ref 80.0–100.0)
Platelets: 323 10*3/uL (ref 150–400)
RBC: 4.43 MIL/uL (ref 3.87–5.11)
RDW: 13.9 % (ref 11.5–15.5)
WBC: 5.9 10*3/uL (ref 4.0–10.5)
nRBC: 0 % (ref 0.0–0.2)

## 2023-02-28 LAB — TYPE AND SCREEN: ABO/RH(D): A POS

## 2023-03-02 MED ORDER — GENTAMICIN SULFATE 40 MG/ML IJ SOLN
400.0000 mg | INTRAVENOUS | Status: AC
  Administered 2023-03-03: 400 mg via INTRAVENOUS
  Filled 2023-03-02 (×2): qty 10

## 2023-03-02 MED ORDER — CLINDAMYCIN PHOSPHATE 900 MG/50ML IV SOLN
900.0000 mg | INTRAVENOUS | Status: AC
  Administered 2023-03-03: 900 mg via INTRAVENOUS

## 2023-03-02 MED ORDER — GENTAMICIN SULFATE 40 MG/ML IJ SOLN
400.0000 mg | INTRAVENOUS | Status: DC
  Filled 2023-03-02: qty 10

## 2023-03-02 MED ORDER — CLINDAMYCIN PHOSPHATE 900 MG/50ML IV SOLN: 900.0000 mg | INTRAVENOUS | Status: DC

## 2023-03-03 ENCOUNTER — Encounter (HOSPITAL_BASED_OUTPATIENT_CLINIC_OR_DEPARTMENT_OTHER): Payer: Self-pay | Admitting: Obstetrics and Gynecology

## 2023-03-03 ENCOUNTER — Ambulatory Visit (HOSPITAL_BASED_OUTPATIENT_CLINIC_OR_DEPARTMENT_OTHER): Payer: Medicare Other | Admitting: Anesthesiology

## 2023-03-03 ENCOUNTER — Encounter (HOSPITAL_BASED_OUTPATIENT_CLINIC_OR_DEPARTMENT_OTHER)
Admission: RE | Disposition: A | Discharge status: Home / Self Care | Payer: Self-pay | Source: Home / Self Care | Attending: Obstetrics and Gynecology

## 2023-03-03 ENCOUNTER — Ambulatory Visit (HOSPITAL_BASED_OUTPATIENT_CLINIC_OR_DEPARTMENT_OTHER)
Admission: RE | Admit: 2023-03-03 | Discharge: 2023-03-04 | Disposition: A | Discharge status: Home / Self Care | Payer: Medicare Other | Attending: Obstetrics and Gynecology | Admitting: Obstetrics and Gynecology

## 2023-03-03 ENCOUNTER — Other Ambulatory Visit: Payer: Self-pay

## 2023-03-03 DIAGNOSIS — M810 Age-related osteoporosis without current pathological fracture: Secondary | ICD-10-CM | POA: Diagnosis not present

## 2023-03-03 DIAGNOSIS — Z6841 Body Mass Index (BMI) 40.0 and over, adult: Secondary | ICD-10-CM | POA: Insufficient documentation

## 2023-03-03 DIAGNOSIS — Z01818 Encounter for other preprocedural examination: Secondary | ICD-10-CM

## 2023-03-03 DIAGNOSIS — D251 Intramural leiomyoma of uterus: Secondary | ICD-10-CM | POA: Insufficient documentation

## 2023-03-03 DIAGNOSIS — Z79899 Other long term (current) drug therapy: Secondary | ICD-10-CM | POA: Diagnosis not present

## 2023-03-03 DIAGNOSIS — M1990 Unspecified osteoarthritis, unspecified site: Secondary | ICD-10-CM | POA: Insufficient documentation

## 2023-03-03 DIAGNOSIS — K219 Gastro-esophageal reflux disease without esophagitis: Secondary | ICD-10-CM | POA: Insufficient documentation

## 2023-03-03 DIAGNOSIS — F418 Other specified anxiety disorders: Secondary | ICD-10-CM

## 2023-03-03 DIAGNOSIS — N950 Postmenopausal bleeding: Secondary | ICD-10-CM | POA: Diagnosis not present

## 2023-03-03 DIAGNOSIS — Z791 Long term (current) use of non-steroidal anti-inflammatories (NSAID): Secondary | ICD-10-CM | POA: Diagnosis not present

## 2023-03-03 HISTORY — PX: LAPAROSCOPIC VAGINAL HYSTERECTOMY WITH SALPINGO OOPHORECTOMY: SHX6681

## 2023-03-03 HISTORY — DX: Pneumonia, unspecified organism: J18.9

## 2023-03-03 LAB — CBC
HCT: 41.2 % (ref 36.0–46.0)
Hemoglobin: 13.3 g/dL (ref 12.0–15.0)
MCH: 28.9 pg (ref 26.0–34.0)
MCHC: 32.3 g/dL (ref 30.0–36.0)
MCV: 89.4 fL (ref 80.0–100.0)
Platelets: 328 10*3/uL (ref 150–400)
RBC: 4.61 MIL/uL (ref 3.87–5.11)
RDW: 14.1 % (ref 11.5–15.5)
WBC: 7.3 10*3/uL (ref 4.0–10.5)
nRBC: 0 % (ref 0.0–0.2)

## 2023-03-03 LAB — TYPE AND SCREEN
ABO/RH(D): A POS
Antibody Screen: NEGATIVE
Antibody Screen: NEGATIVE

## 2023-03-03 SURGERY — HYSTERECTOMY, VAGINAL, LAPAROSCOPY-ASSISTED, WITH SALPINGO-OOPHORECTOMY
Anesthesia: General | Laterality: Bilateral

## 2023-03-03 MED ORDER — PROMETHAZINE HCL 25 MG/ML IJ SOLN: 6.2500 mg | INTRAMUSCULAR | Status: DC | PRN

## 2023-03-03 MED ORDER — MIDAZOLAM HCL 2 MG/2ML IJ SOLN
INTRAMUSCULAR | Status: DC | PRN
  Administered 2023-03-03: 2 mg via INTRAVENOUS

## 2023-03-03 MED ORDER — LIDOCAINE 2% (20 MG/ML) 5 ML SYRINGE
INTRAMUSCULAR | Status: DC | PRN
  Administered 2023-03-03: 100 mg via INTRAVENOUS

## 2023-03-03 MED ORDER — KETOROLAC TROMETHAMINE 30 MG/ML IJ SOLN
INTRAMUSCULAR | Status: AC
  Filled 2023-03-03: qty 1

## 2023-03-03 MED ORDER — PROPOFOL 10 MG/ML IV BOLUS
INTRAVENOUS | Status: AC
  Filled 2023-03-03: qty 20

## 2023-03-03 MED ORDER — OXYCODONE HCL 5 MG PO TABS
ORAL_TABLET | ORAL | Status: AC
  Filled 2023-03-03: qty 2

## 2023-03-03 MED ORDER — PROPOFOL 10 MG/ML IV BOLUS
INTRAVENOUS | Status: DC | PRN
  Administered 2023-03-03: 150 mg via INTRAVENOUS

## 2023-03-03 MED ORDER — POVIDONE-IODINE 10 % EX SWAB: 2.0000 | Freq: Once | CUTANEOUS | Status: DC

## 2023-03-03 MED ORDER — PROPOFOL 1000 MG/100ML IV EMUL
INTRAVENOUS | Status: AC
  Filled 2023-03-03: qty 100

## 2023-03-03 MED ORDER — FENTANYL CITRATE (PF) 250 MCG/5ML IJ SOLN
INTRAMUSCULAR | Status: AC
  Filled 2023-03-03: qty 5

## 2023-03-03 MED ORDER — DEXAMETHASONE SODIUM PHOSPHATE 10 MG/ML IJ SOLN
INTRAMUSCULAR | Status: AC
  Filled 2023-03-03: qty 1

## 2023-03-03 MED ORDER — PROPOFOL 500 MG/50ML IV EMUL
INTRAVENOUS | Status: DC | PRN
  Administered 2023-03-03: 180 ug/kg/min via INTRAVENOUS

## 2023-03-03 MED ORDER — FENTANYL CITRATE (PF) 250 MCG/5ML IJ SOLN
INTRAMUSCULAR | Status: DC | PRN
  Administered 2023-03-03: 25 ug via INTRAVENOUS
  Administered 2023-03-03 (×3): 50 ug via INTRAVENOUS

## 2023-03-03 MED ORDER — KETOROLAC TROMETHAMINE 30 MG/ML IJ SOLN
30.0000 mg | Freq: Four times a day (QID) | INTRAMUSCULAR | Status: DC
  Administered 2023-03-03: 30 mg via INTRAVENOUS

## 2023-03-03 MED ORDER — OXYCODONE HCL 5 MG PO TABS
5.0000 mg | ORAL_TABLET | ORAL | Status: DC | PRN
  Administered 2023-03-03 – 2023-03-04 (×2): 10 mg via ORAL

## 2023-03-03 MED ORDER — ONDANSETRON HCL 4 MG PO TABS: 4.0000 mg | ORAL_TABLET | Freq: Four times a day (QID) | ORAL | Status: DC | PRN

## 2023-03-03 MED ORDER — DIPHENHYDRAMINE HCL 50 MG/ML IJ SOLN
INTRAMUSCULAR | Status: AC
  Filled 2023-03-03: qty 1

## 2023-03-03 MED ORDER — MENTHOL 3 MG MT LOZG: 1.0000 | LOZENGE | OROMUCOSAL | Status: DC | PRN

## 2023-03-03 MED ORDER — CLINDAMYCIN PHOSPHATE 900 MG/50ML IV SOLN
INTRAVENOUS | Status: AC
  Filled 2023-03-03: qty 50

## 2023-03-03 MED ORDER — CITALOPRAM HYDROBROMIDE 20 MG PO TABS
20.0000 mg | ORAL_TABLET | Freq: Every day | ORAL | Status: DC
  Filled 2023-03-03: qty 1

## 2023-03-03 MED ORDER — KETOROLAC TROMETHAMINE 30 MG/ML IJ SOLN
INTRAMUSCULAR | Status: DC | PRN
  Administered 2023-03-03: 30 mg via INTRAVENOUS

## 2023-03-03 MED ORDER — OXYCODONE HCL 5 MG/5ML PO SOLN: 5.0000 mg | Freq: Once | ORAL | Status: DC | PRN

## 2023-03-03 MED ORDER — FAMOTIDINE 20 MG PO TABS
20.0000 mg | ORAL_TABLET | Freq: Two times a day (BID) | ORAL | Status: DC
  Administered 2023-03-03: 20 mg via ORAL

## 2023-03-03 MED ORDER — MIDAZOLAM HCL 2 MG/2ML IJ SOLN
INTRAMUSCULAR | Status: AC
  Filled 2023-03-03: qty 2

## 2023-03-03 MED ORDER — IBUPROFEN 200 MG PO TABS
600.0000 mg | ORAL_TABLET | Freq: Four times a day (QID) | ORAL | Status: DC
  Administered 2023-03-03 – 2023-03-04 (×2): 600 mg via ORAL

## 2023-03-03 MED ORDER — GABAPENTIN 300 MG PO CAPS
300.0000 mg | ORAL_CAPSULE | ORAL | Status: AC
  Administered 2023-03-03: 300 mg via ORAL

## 2023-03-03 MED ORDER — MEPERIDINE HCL 25 MG/ML IJ SOLN: 6.2500 mg | INTRAMUSCULAR | Status: DC | PRN

## 2023-03-03 MED ORDER — OXYCODONE HCL 5 MG PO TABS: 5.0000 mg | ORAL_TABLET | Freq: Once | ORAL | Status: DC | PRN

## 2023-03-03 MED ORDER — CLONAZEPAM 0.5 MG PO TABS: 0.5000 mg | ORAL_TABLET | Freq: Two times a day (BID) | ORAL | Status: DC | PRN

## 2023-03-03 MED ORDER — ROCURONIUM BROMIDE 10 MG/ML (PF) SYRINGE
PREFILLED_SYRINGE | INTRAVENOUS | Status: AC
  Filled 2023-03-03: qty 10

## 2023-03-03 MED ORDER — ONDANSETRON HCL 4 MG/2ML IJ SOLN
INTRAMUSCULAR | Status: AC
  Filled 2023-03-03: qty 2

## 2023-03-03 MED ORDER — LACTATED RINGERS IV SOLN: INTRAVENOUS | Status: DC

## 2023-03-03 MED ORDER — HYDROMORPHONE HCL 1 MG/ML IJ SOLN: 0.2500 mg | INTRAMUSCULAR | Status: DC | PRN

## 2023-03-03 MED ORDER — DIPHENHYDRAMINE HCL 50 MG/ML IJ SOLN
INTRAMUSCULAR | Status: DC | PRN
  Administered 2023-03-03: 12.5 mg via INTRAVENOUS

## 2023-03-03 MED ORDER — BUPIVACAINE HCL (PF) 0.25 % IJ SOLN
INTRAMUSCULAR | Status: DC | PRN
  Administered 2023-03-03: 9 mL

## 2023-03-03 MED ORDER — ROCURONIUM BROMIDE 10 MG/ML (PF) SYRINGE
PREFILLED_SYRINGE | INTRAVENOUS | Status: DC | PRN
  Administered 2023-03-03: 100 mg via INTRAVENOUS

## 2023-03-03 MED ORDER — ALBUTEROL SULFATE HFA 108 (90 BASE) MCG/ACT IN AERS: 2.0000 | INHALATION_SPRAY | Freq: Four times a day (QID) | RESPIRATORY_TRACT | Status: DC | PRN

## 2023-03-03 MED ORDER — SUGAMMADEX SODIUM 200 MG/2ML IV SOLN
INTRAVENOUS | Status: DC | PRN
  Administered 2023-03-03: 250 mg via INTRAVENOUS

## 2023-03-03 MED ORDER — DEXTROSE IN LACTATED RINGERS 5 % IV SOLN: INTRAVENOUS | Status: DC

## 2023-03-03 MED ORDER — DEXAMETHASONE SODIUM PHOSPHATE 10 MG/ML IJ SOLN
INTRAMUSCULAR | Status: DC | PRN
  Administered 2023-03-03: 10 mg via INTRAVENOUS

## 2023-03-03 MED ORDER — IBUPROFEN 200 MG PO TABS
ORAL_TABLET | ORAL | Status: AC
  Filled 2023-03-03: qty 3

## 2023-03-03 MED ORDER — FAMOTIDINE 20 MG PO TABS
ORAL_TABLET | ORAL | Status: AC
  Filled 2023-03-03: qty 1

## 2023-03-03 MED ORDER — PHENYLEPHRINE 80 MCG/ML (10ML) SYRINGE FOR IV PUSH (FOR BLOOD PRESSURE SUPPORT)
PREFILLED_SYRINGE | INTRAVENOUS | Status: DC | PRN
  Administered 2023-03-03 (×2): 80 ug via INTRAVENOUS

## 2023-03-03 MED ORDER — ONDANSETRON HCL 4 MG/2ML IJ SOLN: 4.0000 mg | Freq: Four times a day (QID) | INTRAMUSCULAR | Status: DC | PRN

## 2023-03-03 MED ORDER — GABAPENTIN 300 MG PO CAPS
ORAL_CAPSULE | ORAL | Status: AC
  Filled 2023-03-03: qty 1

## 2023-03-03 MED ORDER — ONDANSETRON HCL 4 MG/2ML IJ SOLN
INTRAMUSCULAR | Status: DC | PRN
  Administered 2023-03-03: 4 mg via INTRAVENOUS

## 2023-03-03 MED ORDER — ATORVASTATIN CALCIUM 20 MG PO TABS
20.0000 mg | ORAL_TABLET | Freq: Every day | ORAL | Status: DC
  Administered 2023-03-03: 20 mg via ORAL
  Filled 2023-03-03: qty 1

## 2023-03-03 MED ORDER — SODIUM CHLORIDE 0.9 % IR SOLN
Status: DC | PRN
  Administered 2023-03-03: 1000 mL

## 2023-03-03 MED ORDER — LIDOCAINE HCL (PF) 2 % IJ SOLN
INTRAMUSCULAR | Status: AC
  Filled 2023-03-03: qty 5

## 2023-03-03 SURGICAL SUPPLY — 47 items
ADH SKN CLS APL DERMABOND .7 (GAUZE/BANDAGES/DRESSINGS) ×1
CABLE HIGH FREQUENCY MONO STRZ (ELECTRODE) IMPLANT
CATH ROBINSON RED A/P 16FR (CATHETERS) ×1 IMPLANT
COVER BACK TABLE 60X90IN (DRAPES) ×1 IMPLANT
COVER MAYO STAND STRL (DRAPES) ×2 IMPLANT
COVER SURGICAL LIGHT HANDLE (MISCELLANEOUS) ×1 IMPLANT
DERMABOND ADVANCED .7 DNX12 (GAUZE/BANDAGES/DRESSINGS) ×1 IMPLANT
DRAPE SURG IRRIG POUCH 19X23 (DRAPES) ×1 IMPLANT
DRSG OPSITE POSTOP 3X4 (GAUZE/BANDAGES/DRESSINGS) IMPLANT
DURAPREP 26ML APPLICATOR (WOUND CARE) ×1 IMPLANT
ELECT REM PT RETURN 9FT ADLT (ELECTROSURGICAL) ×1
ELECTRODE REM PT RTRN 9FT ADLT (ELECTROSURGICAL) ×1 IMPLANT
GAUZE 4X4 16PLY ~~LOC~~+RFID DBL (SPONGE) ×3 IMPLANT
GLOVE BIO SURGEON STRL SZ7 (GLOVE) ×2 IMPLANT
GLOVE ECLIPSE 6.5 STRL STRAW (GLOVE) ×1 IMPLANT
HOLDER FOLEY CATH W/STRAP (MISCELLANEOUS) ×1 IMPLANT
IRRIG SUCT STRYKERFLOW 2 WTIP (MISCELLANEOUS) ×1
IRRIGATION SUCT STRKRFLW 2 WTP (MISCELLANEOUS) IMPLANT
KIT PINK PAD W/HEAD ARE REST (MISCELLANEOUS) ×1
KIT PINK PAD W/HEAD ARM REST (MISCELLANEOUS) ×1 IMPLANT
KIT TURNOVER CYSTO (KITS) ×1 IMPLANT
LIGASURE IMPACT 36 18CM CVD LR (INSTRUMENTS) IMPLANT
NDL INSUFFLATION 14GA 120MM (NEEDLE) ×1 IMPLANT
NEEDLE INSUFFLATION 14GA 120MM (NEEDLE) ×1 IMPLANT
NS IRRIG 1000ML POUR BTL (IV SOLUTION) ×1 IMPLANT
PACK LAVH (CUSTOM PROCEDURE TRAY) ×1 IMPLANT
PACK ROBOTIC GOWN (GOWN DISPOSABLE) ×1 IMPLANT
PROTECTOR NERVE ULNAR (MISCELLANEOUS) ×2 IMPLANT
SEALER TISSUE G2 CVD JAW 45CM (ENDOMECHANICALS) IMPLANT
SET TUBE SMOKE EVAC HIGH FLOW (TUBING) ×1 IMPLANT
SLEEVE SCD COMPRESS KNEE MED (STOCKING) ×1 IMPLANT
SPIKE FLUID TRANSFER (MISCELLANEOUS) IMPLANT
SPONGE T-LAP 4X18 ~~LOC~~+RFID (SPONGE) ×1 IMPLANT
SUT MON AB 2-0 CT1 36 (SUTURE) IMPLANT
SUT VIC AB 0 CT1 18XCR BRD8 (SUTURE) ×2 IMPLANT
SUT VIC AB 0 CT1 36 (SUTURE) ×1 IMPLANT
SUT VIC AB 0 CT1 8-18 (SUTURE) ×2
SUT VIC AB 2-0 CT1 (SUTURE) IMPLANT
SUT VIC AB 4-0 PS2 18 (SUTURE) ×1 IMPLANT
SUT VICRYL 0 TIES 12 18 (SUTURE) ×1 IMPLANT
SUT VICRYL 0 UR6 27IN ABS (SUTURE) IMPLANT
TOWEL OR 17X24 6PK STRL BLUE (TOWEL DISPOSABLE) ×1 IMPLANT
TRAY FOLEY W/BAG SLVR 14FR LF (SET/KITS/TRAYS/PACK) ×1 IMPLANT
TROCAR Z-THREAD BLADED 11X100M (TROCAR) ×1 IMPLANT
TROCAR Z-THREAD BLADED 5X100MM (TROCAR) ×1 IMPLANT
TROCAR Z-THREAD FIOS 5X100MM (TROCAR) IMPLANT
WARMER LAPAROSCOPE (MISCELLANEOUS) ×1 IMPLANT

## 2023-03-03 NOTE — Anesthesia Procedure Notes (Signed)
Procedure Name: Intubation Date/Time: 03/03/2023 7:32 AM  Performed by: Dairl Ponder, CRNAPre-anesthesia Checklist: Patient identified, Emergency Drugs available, Suction available and Patient being monitored Patient Re-evaluated:Patient Re-evaluated prior to induction Oxygen Delivery Method: Circle System Utilized Preoxygenation: Pre-oxygenation with 100% oxygen Induction Type: IV induction Ventilation: Mask ventilation without difficulty and Oral airway inserted - appropriate to patient size Laryngoscope Size: Mac and 3 Grade View: Grade III Tube type: Oral Tube size: 7.0 mm Number of attempts: 1 Airway Equipment and Method: Stylet, Oral airway and Bougie stylet Placement Confirmation: positive ETCO2 and breath sounds checked- equal and bilateral Secured at: 22 cm Tube secured with: Tape Dental Injury: Teeth and Oropharynx as per pre-operative assessment

## 2023-03-03 NOTE — Anesthesia Preprocedure Evaluation (Addendum)
Anesthesia Evaluation  Patient identified by MRN, date of birth, ID band Patient awake    Reviewed: Allergy & Precautions, NPO status , Patient's Chart, lab work & pertinent test results  History of Anesthesia Complications (+) PONV and history of anesthetic complications  Airway Mallampati: IV  TM Distance: <3 FB Neck ROM: Full    Dental  (+) Teeth Intact, Dental Advisory Given   Pulmonary neg shortness of breath, neg sleep apnea, neg COPD, neg recent URI   breath sounds clear to auscultation       Cardiovascular negative cardio ROS  Rhythm:Regular     Neuro/Psych  PSYCHIATRIC DISORDERS Anxiety Depression    Spondylolisthesis    GI/Hepatic Neg liver ROS,GERD  Medicated and Controlled,,  Endo/Other    Morbid obesity  Renal/GU negative Renal ROS     Musculoskeletal  (+) Arthritis , Osteoarthritis,    Abdominal  (+) + obese  Peds  Hematology negative hematology ROS (+) Lab Results      Component                Value               Date                      WBC                      6.4                 09/18/2021                HGB                      13.7                09/18/2021                HCT                      42.7                09/18/2021                MCV                      89.5                09/18/2021                PLT                      346                 09/18/2021              Anesthesia Other Findings   Reproductive/Obstetrics                             Anesthesia Physical Anesthesia Plan  ASA: 3  Anesthesia Plan: General   Post-op Pain Management: Gabapentin PO (pre-op)   Induction: Intravenous  PONV Risk Score and Plan: 4 or greater and Ondansetron, Dexamethasone, Treatment may vary due to age or medical condition, Propofol infusion and TIVA  Airway Management Planned: Oral ETT  Additional Equipment: None  Intra-op Plan:   Post-operative Plan:  Extubation in OR  Informed Consent:  I have reviewed the patients History and Physical, chart, labs and discussed the procedure including the risks, benefits and alternatives for the proposed anesthesia with the patient or authorized representative who has indicated his/her understanding and acceptance.     Dental advisory given  Plan Discussed with: CRNA and Anesthesiologist  Anesthesia Plan Comments:         Anesthesia Quick Evaluation

## 2023-03-03 NOTE — Op Note (Signed)
Preoperative diagnosis: Postmenopausal bleeding  Postoperative diagnosis: Same  Procedure: LAVH BSO  Surgeon: Marcelle Overlie  Assistant: Johny Drilling  EBL: 125  Specimens removed: Uterus bilateral tubes and ovaries  Procedure and findings:  The patient was taken to the operating after the level of general anesthesia was obtained with the legs in stirrups the abdomen perineum and vagina were prepped and draped Foley catheter position this was done after appropriate timeouts were taken.  Pelvic anatomy was positioned on the uterus.  Attention directed to the abdomen subumbilical area was infiltrated with quarter percent Marcaine plain a small incision was made and the Veress needle was introduced without difficulty.  Intra-abdominal placement was verified by pressure and water testing.  A 3 L pneumoperitoneum was then created, laparoscopic trocar and sleeve were then removed, and the scope was inserted there was no evidence of any bleeding or trauma patient placed in Trendelenburg 3 fingerbreadths above the symphysis in the midline and a 5 mm trocar was inserted under direct visualization and a blunt probe was positioned.  Pelvic findings revealed uterus normal size bilateral tubes and ovaries unremarkable there was some minimal minimal left omental adhesions no other abnormalities.  Starting on the right atraumatic grasper was used to grasp the distal 2, the right IP ligament was observed course of the ureter well below, the Enseal device was then used to coagulate and divide the the right IP ligament close to the ovary this was carried down to the right round ligament which was similarly coagulated and divided.  This area was hemostatic the exact same repeated on the left side.  Once this was freed up the vaginal portion of the procedure was started.  Legs were extended, weighted speculum was positioned cervix grasped with tenaculum Bovie was then used to circumferentially coagulate and incised the vaginal  mucosa posterior colpotomy performed without difficulty a longer weighted retractor was then placed into the cul-de-sac.  Bladder advanced superiorly with sharp and blunt dissection.  Using the LigaSure device the uterosacral ligament, cardinal ligament upper broad ligament pedicles were clamped divided with LigaSure staying close to the uterus.  Portions of the fundus were then morcellated to allow for better visualization remaining pedicles were clamped divided and suture-ligated.  Once this was accomplished and the uterus bilateral tubes and areas removed, vaginal mucosa was closed with rectal left with interrupted 2-0 Vicryl sutures.  This was hemostatic pelvic catheter position draining clear urine.  Attention directed to the abdomen repeat laparoscopy was carried out the patient was placed in Trendelenburg and the suction irrigator was placed and the operative site was irrigated under pressure aspirated and then observed over several minutes noted to be hemostatic at reduced pressure.  Instruments removed, gas allowed escape the upper incision closed with a 4-0 Monocryl subcuticular Dermabond on the lower end.  She tolerated this well went to recovery room in good condition.  Dictated with [employer] [facility]  Meriel Pica MD

## 2023-03-03 NOTE — Progress Notes (Signed)
The patient was re-examined with no change in status 

## 2023-03-03 NOTE — Anesthesia Postprocedure Evaluation (Signed)
Anesthesia Post Note  Patient: Sarah Spencer  Procedure(s) Performed: LAPAROSCOPIC ASSISTED VAGINAL HYSTERECTOMY WITH SALPINGO OOPHORECTOMY (Bilateral)     Patient location during evaluation: PACU Anesthesia Type: General Level of consciousness: awake and alert Pain management: pain level controlled Vital Signs Assessment: post-procedure vital signs reviewed and stable Respiratory status: spontaneous breathing, nonlabored ventilation and respiratory function stable Cardiovascular status: blood pressure returned to baseline and stable Postop Assessment: no apparent nausea or vomiting Anesthetic complications: no   No notable events documented.  Last Vitals:  Vitals:   03/03/23 1020 03/03/23 1057  BP:  (!) 147/80  Pulse: 72 70  Resp: 17 18  Temp:  36.8 C  SpO2: 96% 96%    Last Pain:  Vitals:   03/03/23 1057  TempSrc:   PainSc: 0-No pain                 Lowella Curb

## 2023-03-03 NOTE — Transfer of Care (Signed)
Immediate Anesthesia Transfer of Care Note  Patient: Sarah Spencer  Procedure(s) Performed: LAPAROSCOPIC ASSISTED VAGINAL HYSTERECTOMY WITH SALPINGO OOPHORECTOMY (Bilateral)  Patient Location: PACU  Anesthesia Type:General  Level of Consciousness: drowsy and patient cooperative  Airway & Oxygen Therapy: Patient Spontanous Breathing and Patient connected to face mask oxygen  Post-op Assessment: Report given to RN and Post -op Vital signs reviewed and stable  Post vital signs: Reviewed and stable  Last Vitals:  Vitals Value Taken Time  BP    Temp 36.4 C 03/03/23 0928  Pulse    Resp    SpO2      Last Pain:  Vitals:   03/03/23 0604  TempSrc: Oral  PainSc: 0-No pain      Patients Stated Pain Goal: 3 (03/03/23 0604)  Complications: No notable events documented.

## 2023-03-04 ENCOUNTER — Encounter (HOSPITAL_BASED_OUTPATIENT_CLINIC_OR_DEPARTMENT_OTHER): Payer: Self-pay | Admitting: Obstetrics and Gynecology

## 2023-03-04 DIAGNOSIS — N950 Postmenopausal bleeding: Secondary | ICD-10-CM | POA: Diagnosis not present

## 2023-03-04 LAB — CBC
HCT: 34.7 % — ABNORMAL LOW (ref 36.0–46.0)
Hemoglobin: 11.1 g/dL — ABNORMAL LOW (ref 12.0–15.0)
MCH: 29.1 pg (ref 26.0–34.0)
MCHC: 32 g/dL (ref 30.0–36.0)
MCV: 90.8 fL (ref 80.0–100.0)
Platelets: 276 10*3/uL (ref 150–400)
RBC: 3.82 MIL/uL — ABNORMAL LOW (ref 3.87–5.11)
RDW: 14 % (ref 11.5–15.5)
WBC: 10.9 10*3/uL — ABNORMAL HIGH (ref 4.0–10.5)
nRBC: 0 % (ref 0.0–0.2)

## 2023-03-04 LAB — SURGICAL PATHOLOGY

## 2023-03-04 MED ORDER — OXYCODONE HCL 5 MG PO TABS
ORAL_TABLET | ORAL | Status: AC
  Filled 2023-03-04: qty 2

## 2023-03-04 MED ORDER — OXYCODONE HCL 5 MG PO TABS: 5.0000 mg | ORAL_TABLET | ORAL | 0 refills | Status: DC | PRN

## 2023-03-04 MED ORDER — IBUPROFEN 200 MG PO TABS
ORAL_TABLET | ORAL | Status: AC
  Filled 2023-03-04: qty 3

## 2023-03-04 MED ORDER — IBUPROFEN 600 MG PO TABS: 600.0000 mg | ORAL_TABLET | Freq: Four times a day (QID) | ORAL | 0 refills | Status: DC | PRN

## 2023-03-04 NOTE — Discharge Summary (Signed)
Physician Discharge Summary  Patient ID: Sarah Spencer MRN: [id number] DOB/AGE: [birth year] 67 y.o.  Admit date: 03/03/2023 Discharge date: 03/04/2023  Admission Diagnoses:PMP bleeding  Discharge Diagnoses:  Principal Problem:   Postmenopausal bleeding   Discharged Condition: good  Hospital Course: adm for LAVH BSO for PMP bleeding>>>on POD 1, ambulating, tol PO, afeb and ready for D/C  Consults: None  Significant Diagnostic Studies: labs:  Results for orders placed or performed during the hospital encounter of 03/03/23 (from the past 48 hour(s))  CBC     Status: None   Collection Time: 03/03/23  5:55 AM  Result Value Ref Range   WBC 7.3 4.0 - 10.5 K/uL   RBC 4.61 3.87 - 5.11 MIL/uL   Hemoglobin 13.3 12.0 - 15.0 g/dL   HCT 40.9 81.1 - 91.4 %   MCV 89.4 80.0 - 100.0 fL   MCH 28.9 26.0 - 34.0 pg   MCHC 32.3 30.0 - 36.0 g/dL   RDW 78.2 95.6 - 21.3 %   Platelets 328 150 - 400 K/uL   nRBC 0.0 0.0 - 0.2 %    Comment: Performed at [facility], [address] [place], [place] [address]  Type and screen [facility]      Status: None   Collection Time: 03/03/23  5:55 AM  Result Value Ref Range   ABO/RH(D) A POS    Antibody Screen NEG    Sample Expiration      03/06/2023,2359 Performed at [facility], [address] [place], [place] [address]   CBC     Status: Abnormal   Collection Time: 03/04/23  3:34 AM  Result Value Ref Range   WBC 10.9 (H) 4.0 - 10.5 K/uL   RBC 3.82 (L) 3.87 - 5.11 MIL/uL   Hemoglobin 11.1 (L) 12.0 - 15.0 g/dL   HCT 29.5 (L) 28.4 - 13.2 %   MCV 90.8 80.0 - 100.0 fL   MCH 29.1 26.0 - 34.0 pg   MCHC 32.0 30.0 - 36.0 g/dL   RDW 44.0 10.2 - 72.5 %   Platelets 276 150 - 400 K/uL   nRBC 0.0 0.0 - 0.2 %    Comment: Performed at [facility], [address], [place], [place] [address]     Treatments: surgery: LAVH BSO  Discharge Exam: Blood pressure 135/71, pulse 80, temperature  98.1 F (36.7 C), resp. rate 14, height 5\' 5"  (1.651 m), weight 112.5 kg, SpO2 98 %. Abd soft + BS, Incs C/D  Disposition: Discharge disposition: 01-Home or Self Care        Allergies as of 03/04/2023       Reactions   Cefdinir Itching   Wellbutrin [bupropion] Nausea And Vomiting        Medication List     TAKE these medications    acetaminophen 325 MG tablet Commonly known as: TYLENOL Take 650 mg by mouth every 6 (six) hours as needed.   albuterol 108 (90 Base) MCG/ACT inhaler Commonly known as: VENTOLIN HFA Inhale 2 puffs into the lungs every 6 (six) hours as needed.   atorvastatin 20 MG tablet Commonly known as: LIPITOR Take 20 mg by mouth at bedtime.   citalopram 20 MG tablet Commonly known as: CELEXA Take 20 mg by mouth daily.   clonazePAM 0.5 MG tablet Commonly known as: KLONOPIN Take 0.25-0.5 mg by mouth 3 (three) times daily as needed for anxiety.   CoQ10 100 MG Caps Take 100 mg by mouth daily.  diclofenac 75 MG EC tablet Commonly known as: VOLTAREN Take 75 mg by mouth daily.   famotidine 20 MG tablet Commonly known as: PEPCID Take 20 mg by mouth 2 (two) times daily.   hyoscyamine 0.125 MG SL tablet Commonly known as: LEVSIN SL Place 1 tablet (0.125 mg total) under the tongue every 4 (four) hours as needed (Abdominal pain and spasms).   ibuprofen 600 MG tablet Commonly known as: ADVIL Take 1 tablet (600 mg total) by mouth every 6 (six) hours as needed for moderate pain. What changed:  medication strength how much to take reasons to take this   montelukast 10 MG tablet Commonly known as: SINGULAIR Take 10 mg by mouth at bedtime.   niacin 500 MG tablet Commonly known as: VITAMIN B3 Take 500 mg by mouth at bedtime.   oxyCODONE 5 MG immediate release tablet Commonly known as: Oxy IR/ROXICODONE Take 1-2 tablets (5-10 mg total) by mouth every 4 (four) hours as needed for moderate pain.   Systane Complete 0.6 % Soln Generic drug:  Propylene Glycol Place 1 drop into both eyes 2 (two) times daily as needed (dry eyes).   Vitamin D3 50 MCG (2000 UT) Tabs Take 2,000 Units by mouth daily.        Follow-up Information     Richarda Overlie, MD. Schedule an appointment as soon as possible for a visit in 1 week(s).   Specialty: Obstetrics and Gynecology Contact information: [address] [place] [address] [phone]                 Signed: Meriel Pica 03/04/2023, 7:53 AM

## 2023-04-27 IMAGING — MR MR LUMBAR SPINE W/O CM
4 of 5 series · 25 of 48 positions shown · non-contrast
Comparison: 02/10/2015 MRI lumbar spine

CLINICAL DATA: Low back pain radiating to left hip

EXAM:
MRI LUMBAR SPINE WITHOUT CONTRAST
TECHNIQUE: Multiplanar, multisequence MR imaging of the lumbar spine was
performed. No intravenous contrast was administered.

[Series 5: T2 · sagittal · 4.0mm · 0.88mm/px · 6 of 17 slices shown (1 of 2)]
[im 1/17]
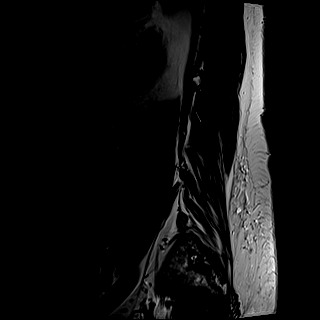
[im 4/17]
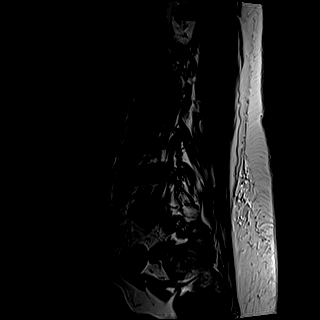
[im 7/17]
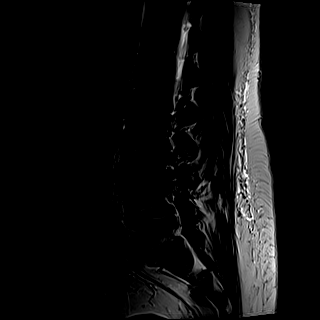
[im 10/17]
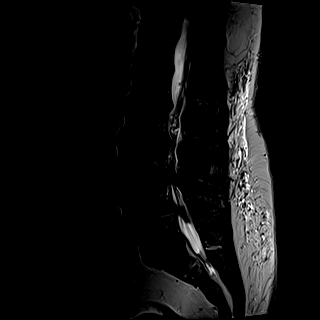
[im 13/17]
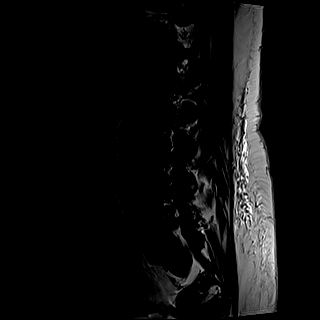
[im 17/17]
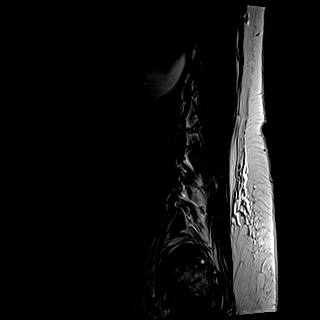

[Series 6: T1 · sagittal · 4.0mm · 0.88mm/px · 6 of 17 slices shown (1 of 2)]
[im 1/17]
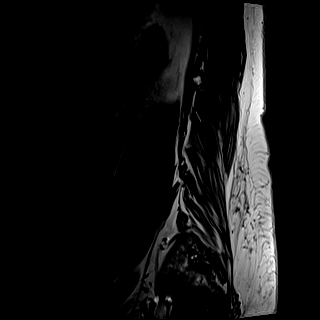
[im 4/17]
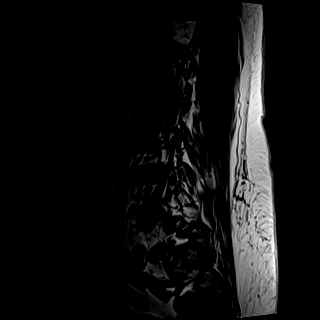
[im 7/17]
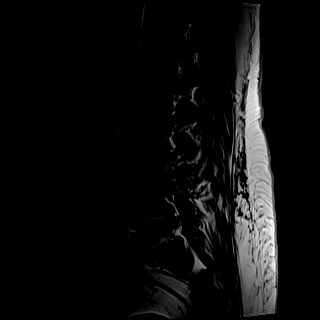
[im 10/17]
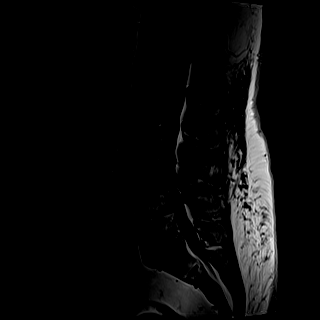
[im 13/17]
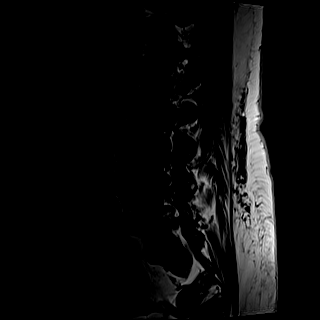
[im 17/17]
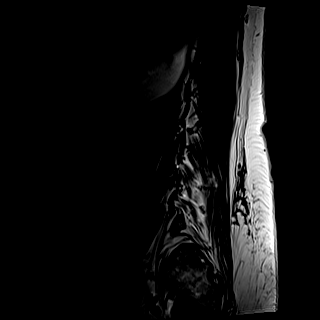

[Series 10: T1 · axial · 4.0mm · 0.35mm/px · z∈[-41,+150]mm · 4 of 45 slices shown (2 of 2)]
[im 1/45]
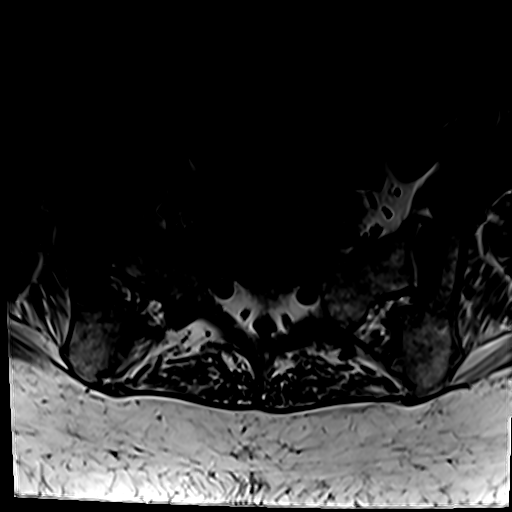
[im 7/45]
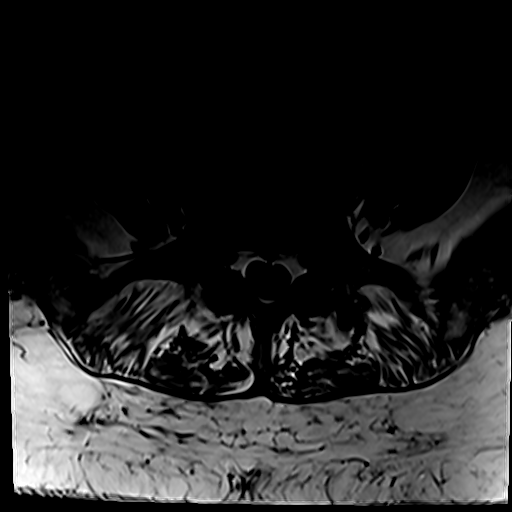
[im 23/45]
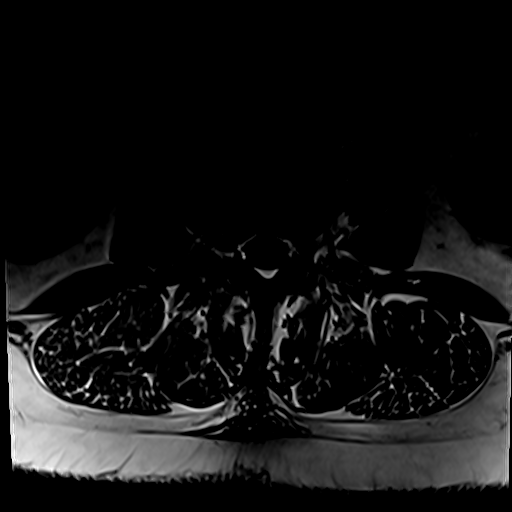
[im 38/45]
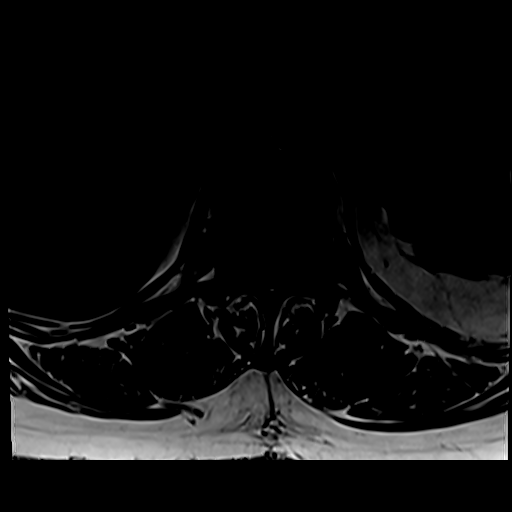

[Series 13: T2 · axial · 4.0mm · 0.35mm/px · z∈[-41,+184]mm · 9 of 45 slices shown (2 of 2)]
[im 1/45]
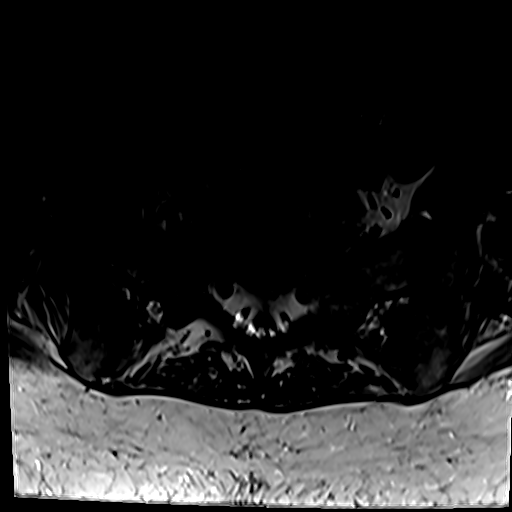
[im 7/45]
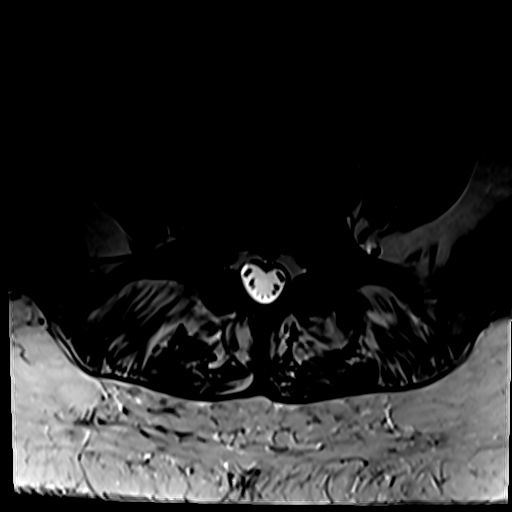
[im 13/45]
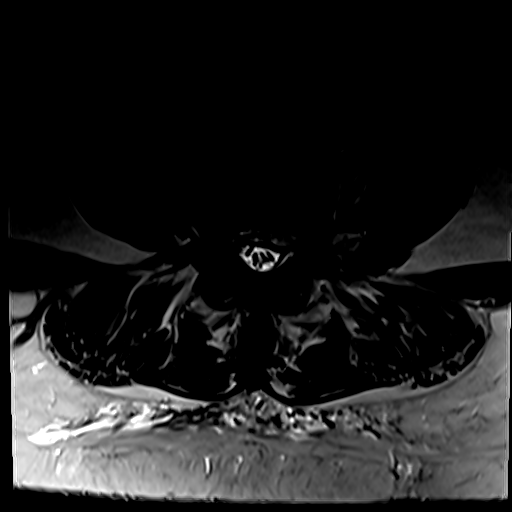
[im 19/45]
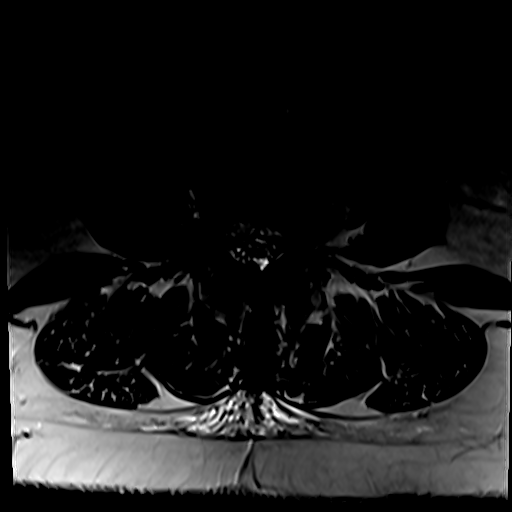
[im 23/45]
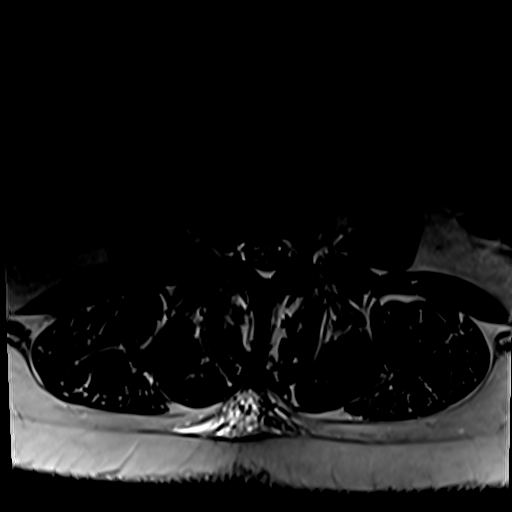
[im 26/45]
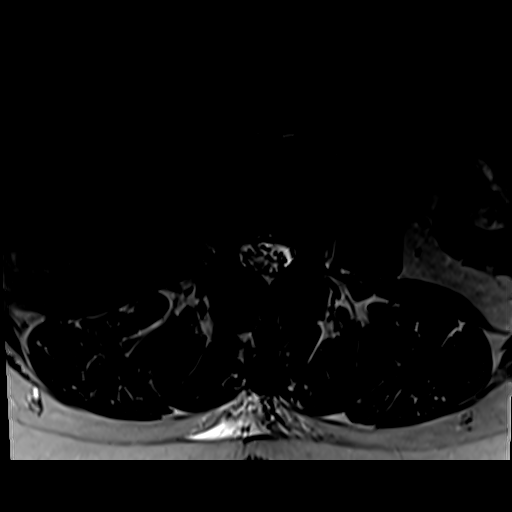
[im 32/45]
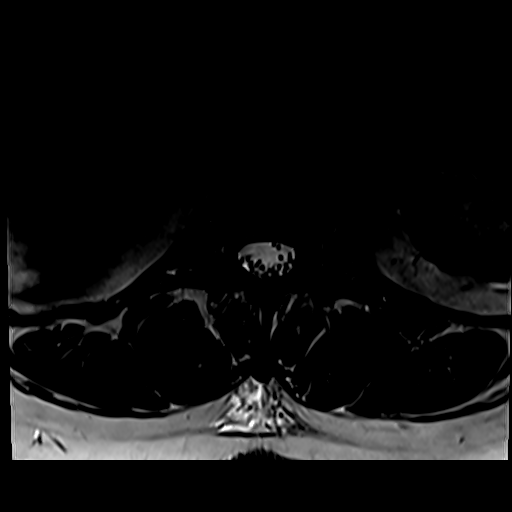
[im 38/45]
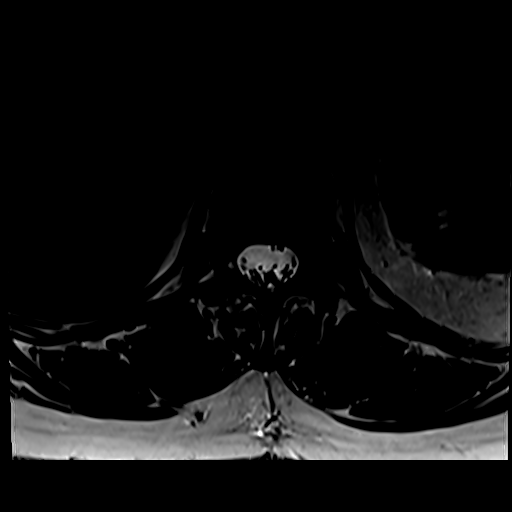
[im 45/45]
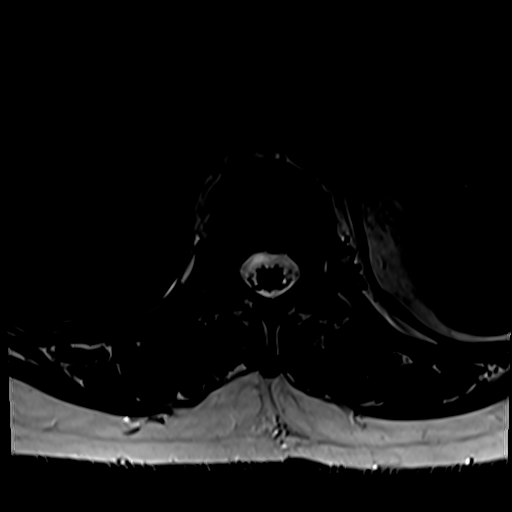

[25 of 48 positions shown; findings below may reference images not displayed]

FINDINGS: Segmentation:  Standard.

Alignment:  Grade 1 anterolisthesis L4 on L5, unchanged.

Vertebrae: No acute fracture or suspicious osseous lesion. Endplate
degenerative changes at L3-L4 and L4-L5.

Conus medullaris and cauda equina: Conus extends to the T12-L1
level. Conus and cauda equina appear normal.

Paraspinal and other soft tissues: Multiple T2 hyperintense lesions
in the liver, most likely cysts.

Disc levels:

T11-T12: No significant disc bulge. No spinal canal stenosis or
neural foraminal narrowing.

T12-L1: No significant disc bulge. No spinal canal stenosis or
neural foraminal narrowing.

L1-L2: Broad-based disc bulge. Mild facet arthropathy. No spinal
canal stenosis. No neural foraminal narrowing.

L2-L3: Broad-based disc bulge. Moderate facet arthropathy with
ligamentum flavum hypertrophy. Moderate to severe spinal canal
stenosis, which has progressed from prior exam. Narrowing of the
bilateral lateral recesses. No neural foraminal narrowing.

L3-L4: Severe disc height loss with disc osteophyte complex and
broad-based disc bulge. Severe facet arthropathy with ligamentum
flavum hypertrophy. Effacement of the lateral recesses. Severe
spinal canal stenosis, similar to the prior exam. Moderate left and
mild right neural foraminal narrowing.

L4-L5: Grade 1 anterolisthesis with disc unroofing. Broad-based disc
bulge. Severe facet arthropathy with ligamentum flavum hypertrophy.
Moderate to severe spinal canal stenosis, which appears progressed
from the prior exam. Effacement of the bilateral lateral recesses.
Mild-to-moderate right neural foraminal narrowing.

L5-S1: Mild disc bulge. Moderate facet arthropathy. No spinal canal
stenosis. No neural foraminal narrowing.
IMPRESSION: 1. L3-L4 severe spinal canal stenosis, similar to the prior exam,
with moderate left and mild right neural foraminal narrowing.
2. L2-L3 moderate to severe spinal canal stenosis, which has
progressed from the prior exam.
3. L4-L5 moderate to severe spinal canal stenosis, which has
progressed from the prior exam, with mild to moderate right neural
foraminal narrowing.
4. Effacement of the lateral recesses at L3-L4 and L4-L5, as well as
narrowing of the lateral recesses L2-L[DATE] impinge the descending
L4, L5, and L3 nerves, respectively.

## 2023-05-09 ENCOUNTER — Emergency Department (HOSPITAL_BASED_OUTPATIENT_CLINIC_OR_DEPARTMENT_OTHER): Payer: Medicare Other | Admitting: Radiology

## 2023-05-09 ENCOUNTER — Other Ambulatory Visit: Payer: Self-pay

## 2023-05-09 ENCOUNTER — Emergency Department (HOSPITAL_BASED_OUTPATIENT_CLINIC_OR_DEPARTMENT_OTHER): Payer: Medicare Other

## 2023-05-09 ENCOUNTER — Emergency Department (HOSPITAL_BASED_OUTPATIENT_CLINIC_OR_DEPARTMENT_OTHER)
Admission: EM | Admit: 2023-05-09 | Discharge: 2023-05-09 | Disposition: A | Discharge status: Home / Self Care | Payer: Medicare Other | Source: Home / Self Care | Attending: Emergency Medicine | Admitting: Emergency Medicine

## 2023-05-09 DIAGNOSIS — S0993XA Unspecified injury of face, initial encounter: Secondary | ICD-10-CM | POA: Diagnosis present

## 2023-05-09 DIAGNOSIS — M25511 Pain in right shoulder: Secondary | ICD-10-CM | POA: Insufficient documentation

## 2023-05-09 DIAGNOSIS — W01198A Fall on same level from slipping, tripping and stumbling with subsequent striking against other object, initial encounter: Secondary | ICD-10-CM | POA: Diagnosis not present

## 2023-05-09 DIAGNOSIS — S0990XA Unspecified injury of head, initial encounter: Secondary | ICD-10-CM

## 2023-05-09 DIAGNOSIS — M79621 Pain in right upper arm: Secondary | ICD-10-CM | POA: Insufficient documentation

## 2023-05-09 DIAGNOSIS — M25531 Pain in right wrist: Secondary | ICD-10-CM | POA: Insufficient documentation

## 2023-05-09 DIAGNOSIS — Z8616 Personal history of COVID-19: Secondary | ICD-10-CM | POA: Insufficient documentation

## 2023-05-09 DIAGNOSIS — M79601 Pain in right arm: Secondary | ICD-10-CM

## 2023-05-09 MED ORDER — ACETAMINOPHEN 325 MG PO TABS: 650.0000 mg | ORAL_TABLET | Freq: Four times a day (QID) | ORAL | 0 refills | Status: DC | PRN

## 2023-05-09 MED ORDER — OXYCODONE HCL 5 MG PO TABS: 5.0000 mg | ORAL_TABLET | Freq: Four times a day (QID) | ORAL | 0 refills | Status: DC | PRN

## 2023-05-09 NOTE — ED Triage Notes (Signed)
Pt POV ambulatory to triage after tripping and falling in store. Pt fell face first, hitting teeth on front counter. Used L arm to brace herself, reporting pain in that arm and wrist. Able to move wrist and lift arm in triage.

## 2023-05-09 NOTE — Discharge Instructions (Addendum)
Please call dentist in the morning to arrange for follow up  Recommend soft/liquid diet until evaluation by dentist  It was a pleasure caring for you today in the emergency department.  Please return to the emergency department for any worsening or worrisome symptoms.

## 2023-05-09 NOTE — ED Provider Notes (Addendum)
[place] EMERGENCY DEPARTMENT AT [facility] Provider Note  CSN: [id number] Arrival date & time: 05/09/23 9562  Chief Complaint(s) Fall  HPI Sarah Spencer is a 67 y.o. female with past medical history as below, significant for anxiety, depression, HLD, GERD, vitamin D deficiency, obesity who presents to the ED with complaint of fall, facial injury.  Just prior to arrival patient reports that she was shopping show, she tripped over a shelf and hit her face on another shelf.  Experienced pain to her right shoulder and right wrist as well.  No LOC, no thinners, she has been amatory since the event.  Pain to her front teeth.  No pain with moving her jaw but does have pain with biting to her front teeth.  No LOC, no chest pain or palpitations.  No nausea or vomiting since event.  No vision or hearing changes.  Does report pain to her right shoulder and right hand/wrist since the fall.  Medication prior to arrival.  Past Medical History Past Medical History:  Diagnosis Date   Allergies    Anxiety    Follows w/ PCP @ [facility], Dr. Sherryll Burger.   Arthritis    knees   Bronchitis    recurrent   COVID-19 11/06/2020   Depression    Follows w/ PCP @ [facility], Dr. Sherryll Burger   Family history of adverse reaction to anesthesia    sister-extreme N&V   GERD (gastroesophageal reflux disease)    Hyperlipidemia    Follows w/ PCP.   Obesity    Osteoporosis    Pedal edema    mild   Pneumonia    as a child and a few times as an adult, hasn't had in years as of 02/11/23   PONV (postoperative nausea and vomiting)    SOBOE (shortness of breath on exertion) 2022   Only when patient had bronchitis. Seen by Dr. Gillermo Murdoch. 08/08/21 TEE was normal EF>55%, mild TR.   Vitamin D deficiency    Patient Active Problem List   Diagnosis Date Noted   Postmenopausal bleeding 03/03/2023   S/P lumbar laminectomy 09/21/2021   Home Medication(s) Prior to Admission medications   Medication  Sig Start Date End Date Taking? Authorizing Provider  acetaminophen (TYLENOL) 325 MG tablet Take 2 tablets (650 mg total) by mouth every 6 (six) hours as needed. 05/09/23  Yes Tanda Rockers A, DO  oxyCODONE (ROXICODONE) 5 MG immediate release tablet Take 1 tablet (5 mg total) by mouth every 6 (six) hours as needed for severe pain. 05/09/23  Yes Sloan Leiter, DO  acetaminophen (TYLENOL) 325 MG tablet Take 650 mg by mouth every 6 (six) hours as needed.    [provider]  albuterol (VENTOLIN HFA) 108 (90 Base) MCG/ACT inhaler Inhale 2 puffs into the lungs every 6 (six) hours as needed. 02/11/21   Rushie Chestnut, PA-C  atorvastatin (LIPITOR) 20 MG tablet Take 20 mg by mouth at bedtime.    [provider]  Cholecalciferol (VITAMIN D3) 50 MCG (2000 UT) TABS Take 2,000 Units by mouth daily.    [provider]  citalopram (CELEXA) 20 MG tablet Take 20 mg by mouth daily.    [provider]  clonazePAM (KLONOPIN) 0.5 MG tablet Take 0.25-0.5 mg by mouth 3 (three) times daily as needed for anxiety. 07/26/20   [provider]  Coenzyme Q10 (COQ10) 100 MG CAPS Take 100 mg by mouth daily.    [provider]  diclofenac (VOLTAREN)  75 MG EC tablet Take 75 mg by mouth daily. 07/06/20   [provider]  famotidine (PEPCID) 20 MG tablet Take 20 mg by mouth 2 (two) times daily.    [provider]  hyoscyamine (LEVSIN SL) 0.125 MG SL tablet Place 1 tablet (0.125 mg total) under the tongue every 4 (four) hours as needed (Abdominal pain and spasms). Patient not taking: Reported on 02/11/2023 09/14/20   Esterwood, Amy S, PA-C  ibuprofen (ADVIL) 600 MG tablet Take 1 tablet (600 mg total) by mouth every 6 (six) hours as needed for moderate pain. 03/04/23   Richarda Overlie, MD  montelukast (SINGULAIR) 10 MG tablet Take 10 mg by mouth at bedtime. 08/15/20   [provider]  niacin 500 MG tablet Take 500 mg by mouth at bedtime.    [provider]  oxyCODONE (OXY IR/ROXICODONE) 5 MG immediate release tablet Take 1-2 tablets (5-10 mg total) by mouth every 4 (four) hours as needed for moderate pain. 03/04/23   Richarda Overlie, MD  Propylene Glycol (SYSTANE COMPLETE) 0.6 % SOLN Place 1 drop into both eyes 2 (two) times daily as needed (dry eyes).    [provider]                                                                                                                                    Past Surgical History Past Surgical History:  Procedure Laterality Date   CERVICAL DISC SURGERY     Dr. Yetta Barre , neurosurgeon.   CESAREAN SECTION  1994   x 1   COLONOSCOPY  2023   ESOPHAGOGASTRODUODENOSCOPY     many years ago as of 02/11/23   LAMINECTOMY WITH POSTERIOR LATERAL ARTHRODESIS LEVEL 3 Bilateral 09/21/2021   Procedure: Laminectomy and Foraminotomy Lumbar Two - Lumbar Three, Lumbar Three - Lumbar Four, Lumbar Four - Lumbar Five - Bilateral, Posterolateral Fusion Lumbar Two - Lumbar Three, Lumbar Three - Lumbar Four, Lumbar Four - Lumbar Five;  Surgeon: Tia Alert, MD;  Location: [facility] OR;  Service: Neurosurgery;  Laterality: Bilateral;   laparascopy     gyn, 1980's   LAPAROSCOPIC VAGINAL HYSTERECTOMY WITH SALPINGO OOPHORECTOMY Bilateral 03/03/2023   Procedure: LAPAROSCOPIC ASSISTED VAGINAL HYSTERECTOMY WITH SALPINGO OOPHORECTOMY;  Surgeon: Richarda Overlie, MD;  Location: [facility];  Service: Gynecology;  Laterality: Bilateral;   TONSILLECTOMY  1964   Family History Family History  Problem Relation Age of Onset   Heart disease Father    Heart disease Sister    Other Brother        step brother   Heart disease Sister    Stroke Sister        caused by medicine   Colon cancer Neg Hx    Esophageal cancer Neg Hx    Rectal cancer Neg Hx     Social History Social History   Tobacco Use   Smoking status: Never  Smokeless tobacco: Never  Vaping Use   Vaping status: Never Used  Substance Use Topics    Alcohol use: No   Drug use: No   Allergies Cefdinir and Wellbutrin [bupropion]  Review of Systems Review of Systems  Constitutional:  Negative for chills and fever.  HENT:  Positive for dental problem.   Eyes:  Negative for visual disturbance.  Respiratory:  Negative for chest tightness and shortness of breath.   Gastrointestinal:  Negative for abdominal pain, nausea and vomiting.  Musculoskeletal:  Positive for arthralgias. Negative for neck stiffness.  Neurological:  Negative for syncope, speech difficulty, weakness, light-headedness and numbness.  All other systems reviewed and are negative.   Physical Exam Vital Signs  I have reviewed the triage vital signs BP (!) 146/66   Pulse 80   Temp 98.8 F (37.1 C) (Oral)   Resp 18   Ht 5\' 5"  (1.651 m)   Wt 114.3 kg   SpO2 97%   BMI 41.93 kg/m  Physical Exam Vitals and nursing note reviewed.  Constitutional:      General: She is not in acute distress.    Appearance: Normal appearance. She is well-developed. She is not ill-appearing, toxic-appearing or diaphoretic.  HENT:     Head: Normocephalic and atraumatic. No raccoon eyes, Battle's sign, right periorbital erythema, left periorbital erythema or laceration.     Jaw: There is normal jaw occlusion. No trismus or pain on movement.     Comments: No tenderness palpation of facial bones    Right Ear: External ear normal.     Left Ear: External ear normal.     Nose: Nose normal.     Right Nostril: No septal hematoma.     Left Nostril: No septal hematoma.     Mouth/Throat:     Mouth: Mucous membranes are moist.     Pharynx: Oropharynx is clear.      Comments: No drooling stridor or trismus Eyes:     General: Vision grossly intact. Gaze aligned appropriately. No scleral icterus.       Right eye: No discharge.        Left eye: No discharge.     Extraocular Movements: Extraocular movements intact.     Conjunctiva/sclera: Conjunctivae normal.     Pupils: Pupils are equal,  round, and reactive to light.  Cardiovascular:     Rate and Rhythm: Normal rate.  Pulmonary:     Effort: Pulmonary effort is normal. No respiratory distress.     Breath sounds: No stridor.  Abdominal:     General: Abdomen is flat. There is no distension.     Tenderness: There is no guarding.  Musculoskeletal:        General: No deformity.       Arms:     Cervical back: Normal range of motion. No rigidity.     Comments: Mild pain with passive/active ROM to RUE. RUE is NVI. Bruising noted to palm. No pain to snuff box. Radial pulse brisk b/l.   Skin:    General: Skin is warm and dry.     Coloration: Skin is not cyanotic, jaundiced or pale.  Neurological:     Mental Status: She is alert and oriented to person, place, and time.     GCS: GCS eye subscore is 4. GCS verbal subscore is 5. GCS motor subscore is 6.     Cranial Nerves: Cranial nerves 2-12 are intact.     Sensory: Sensation is intact.     Motor:  Motor function is intact.     Coordination: Coordination is intact.     Gait: Gait is intact.  Psychiatric:        Speech: Speech normal.        Behavior: Behavior normal. Behavior is cooperative.     ED Results and Treatments Labs (all labs ordered are listed, but only abnormal results are displayed) Labs Reviewed - No data to display                                                                                                                        Radiology CT Maxillofacial Wo Contrast  Result Date: 05/09/2023 CLINICAL DATA:  Tripping and falling in a store. Trauma to face hitting teeth on front counter. EXAM: CT HEAD WITHOUT CONTRAST CT MAXILLOFACIAL WITHOUT CONTRAST TECHNIQUE: Multidetector CT imaging of the head and maxillofacial structures were performed using the standard protocol without intravenous contrast. Multiplanar CT image reconstructions of the maxillofacial structures were also generated. RADIATION DOSE REDUCTION: This exam was performed according to the  departmental dose-optimization program which includes automated exposure control, adjustment of the mA and/or kV according to patient size and/or use of iterative reconstruction technique. COMPARISON:  None Available. FINDINGS: CT HEAD FINDINGS Brain: No evidence of acute infarction, hemorrhage, hydrocephalus, extra-axial collection or mass lesion/mass effect. Chronic left basal ganglia infarct. Vascular: No hyperdense vessel or unexpected calcification. Skull: Normal. Negative for fracture or focal lesion. Other: None. CT MAXILLOFACIAL FINDINGS Osseous: No fracture or mandibular dislocation. No destructive process. Orbits: Negative. No traumatic or inflammatory finding. Sinuses: Clear. Soft tissues: Negative. IMPRESSION: 1. No acute intracranial abnormality. 2. No acute facial bone fracture. Electronically Signed   By: Minerva Fester M.D.   On: 05/09/2023 22:08   CT Head Wo Contrast  Result Date: 05/09/2023 CLINICAL DATA:  Tripping and falling in a store. Trauma to face hitting teeth on front counter. EXAM: CT HEAD WITHOUT CONTRAST CT MAXILLOFACIAL WITHOUT CONTRAST TECHNIQUE: Multidetector CT imaging of the head and maxillofacial structures were performed using the standard protocol without intravenous contrast. Multiplanar CT image reconstructions of the maxillofacial structures were also generated. RADIATION DOSE REDUCTION: This exam was performed according to the departmental dose-optimization program which includes automated exposure control, adjustment of the mA and/or kV according to patient size and/or use of iterative reconstruction technique. COMPARISON:  None Available. FINDINGS: CT HEAD FINDINGS Brain: No evidence of acute infarction, hemorrhage, hydrocephalus, extra-axial collection or mass lesion/mass effect. Chronic left basal ganglia infarct. Vascular: No hyperdense vessel or unexpected calcification. Skull: Normal. Negative for fracture or focal lesion. Other: None. CT MAXILLOFACIAL FINDINGS  Osseous: No fracture or mandibular dislocation. No destructive process. Orbits: Negative. No traumatic or inflammatory finding. Sinuses: Clear. Soft tissues: Negative. IMPRESSION: 1. No acute intracranial abnormality. 2. No acute facial bone fracture. Electronically Signed   By: Minerva Fester M.D.   On: 05/09/2023 22:08   DG Wrist Complete Right  Result Date: 05/09/2023 CLINICAL DATA:  Status post fall. EXAM: RIGHT WRIST -  COMPLETE 3+ VIEW COMPARISON:  None Available. FINDINGS: There is no evidence of an acute fracture or dislocation. Chronic changes are seen involving the distal aspect of the third right metatarsal. Moderate severity degenerative changes are seen along the first carpometacarpal joint, as well as the metatarsophalangeal and interphalangeal joints of the right thumb. Soft tissues are unremarkable. IMPRESSION: 1. Chronic and degenerative changes, as described above, without evidence of an acute osseous abnormality. Electronically Signed   By: Aram Candela M.D.   On: 05/09/2023 21:54   DG Shoulder Right Portable  Result Date: 05/09/2023 CLINICAL DATA:  Status post fall. EXAM: RIGHT SHOULDER - 1 VIEW COMPARISON:  None Available. FINDINGS: There is no evidence of an acute fracture or dislocation. Chronic changes are seen along the greater tubercle of the right humeral head. Moderate to marked severity degenerative changes are seen involving the right acromioclavicular joint and right glenohumeral articulation. Soft tissues are unremarkable. IMPRESSION: Moderate to marked severity degenerative changes without an acute fracture or dislocation. Electronically Signed   By: Aram Candela M.D.   On: 05/09/2023 21:52    Pertinent labs & imaging results that were available during my care of the patient were reviewed by me and considered in my medical decision making (see MDM for details).  Medications Ordered in ED Medications - No data to display                                                                                                                                    Procedures Procedures  (including critical care time)  Medical Decision Making / ED Course    Medical Decision Making:    Sarah Spencer is a 67 y.o. female with past medical history as below, significant for anxiety, depression, HLD, GERD, vitamin D deficiency, obesity who presents to the ED with complaint of fall, facial injury.. The complaint involves an extensive differential diagnosis and also carries with it a high risk of complications and morbidity.  Serious etiology was considered. Ddx includes but is not limited to: Differential diagnoses for head trauma includes subdural hematoma, epidural hematoma, acute concussion, traumatic subarachnoid hemorrhage, cerebral contusions, dental injury, maxillofacial injury, etc.  Complete initial physical exam performed, notably the patient  was no acute distress, sitting upright, neuro nonfocal.    Reviewed and confirmed nursing documentation for past medical history, family history, social history.  Vital signs reviewed.    Clinical Course as of 05/09/23 2258  Fri May 09, 2023  2221 Imaging stable  Give sling for pain to right arm  F/u dentist   Concussion precautions given [SG]    Clinical Course User Index [SG] Sloan Leiter, DO   Imaging reviewed and is stable. Will give sling Analgesia prescribed for home Advised her to follow-up with dentist, possible slight dental avulsion given slight mobility to tooth and scant bleeding from gingiva. CT face was neg for fx Her occlusion appears normal,  no drooling stridor or trismus. No overt dental fracture on exam or imaging   Given concussion precautions  The patient improved significantly and was discharged in stable condition. Detailed discussions were had with the patient regarding current findings, and need for close f/u with PCP or on call doctor. The patient has been instructed to return  immediately if the symptoms worsen in any way for re-evaluation. Patient verbalized understanding and is in agreement with current care plan. All questions answered prior to discharge.            Additional history obtained: -Additional history obtained from na -External records from outside source obtained and reviewed including: Chart review including previous notes, labs, imaging, consultation notes including prior ED visits, prior surgery, hysterectomy 5/24   Lab Tests: na  EKG   EKG Interpretation Date/Time:  Friday May 09 2023 22:32:50 EDT Ventricular Rate:  74 PR Interval:  175 QRS Duration:  97 QT Interval:  410 QTC Calculation: 455 R Axis:   -49  Text Interpretation: Sinus rhythm Left axis deviation Low voltage, precordial leads Abnormal R-wave progression, early transition no stemi no prior tracing Confirmed by Tanda Rockers ([id number]) on 05/09/2023 10:47:19 PM         Imaging Studies ordered: I ordered imaging studies including CT head, CT face, x-ray shoulder, x-ray wrist I independently visualized the following imaging with scope of interpretation limited to determining acute life threatening conditions related to emergency care; findings noted above, significant for stable imaging  I independently visualized and interpreted imaging. I agree with the radiologist interpretation   Medicines ordered and prescription drug management: Meds ordered this encounter  Medications   oxyCODONE (ROXICODONE) 5 MG immediate release tablet    Sig: Take 1 tablet (5 mg total) by mouth every 6 (six) hours as needed for severe pain.    Dispense:  10 tablet    Refill:  0   acetaminophen (TYLENOL) 325 MG tablet    Sig: Take 2 tablets (650 mg total) by mouth every 6 (six) hours as needed.    Dispense:  36 tablet    Refill:  0    -I have reviewed the patients home medicines and have made adjustments as needed   Consultations Obtained: na   Cardiac Monitoring: Continuous  pulse oximetry on room air interpreted by myself is 97 to 100%  Social Determinants of Health:  Diagnosis or treatment significantly limited by social determinants of health: obesity   Reevaluation: After the interventions noted above, I reevaluated the patient and found that they have improved  Co morbidities that complicate the patient evaluation  Past Medical History:  Diagnosis Date   Allergies    Anxiety    Follows w/ PCP @ [facility], Dr. Sherryll Burger.   Arthritis    knees   Bronchitis    recurrent   COVID-19 11/06/2020   Depression    Follows w/ PCP @ [facility], Dr. Sherryll Burger   Family history of adverse reaction to anesthesia    sister-extreme N&V   GERD (gastroesophageal reflux disease)    Hyperlipidemia    Follows w/ PCP.   Obesity    Osteoporosis    Pedal edema    mild   Pneumonia    as a child and a few times as an adult, hasn't had in years as of 02/11/23   PONV (postoperative nausea and vomiting)    SOBOE (shortness of breath on exertion) 2022   Only when patient had bronchitis. Seen by  Dr. Gillermo Murdoch. 08/08/21 TEE was normal EF>55%, mild TR.   Vitamin D deficiency       Dispostion: Disposition decision including need for hospitalization was considered, and patient discharged from emergency department.    Final Clinical Impression(s) / ED Diagnoses Final diagnoses:  Injury of head, initial encounter  Dental injury, initial encounter  Pain of right upper extremity     This chart was dictated using voice recognition software.  Despite best efforts to proofread,  errors can occur which can change the documentation meaning.    Sloan Leiter, DO 05/09/23 2252    Sloan Leiter, DO 05/09/23 2258

## 2023-05-09 NOTE — ED Notes (Signed)
 RN reviewed discharge instructions with pt. Pt verbalized understanding and had no further questions. VSS upon discharge.  

## 2023-06-29 IMAGING — DX DG ABDOMEN 1V
2 series · 2 of 2 positions shown · non-contrast
Comparison: None.

CLINICAL DATA: Constipation, recent lumbar spine surgery

EXAM:
ABDOMEN - 1 VIEW

[t abdomen supine (1 of 2)]
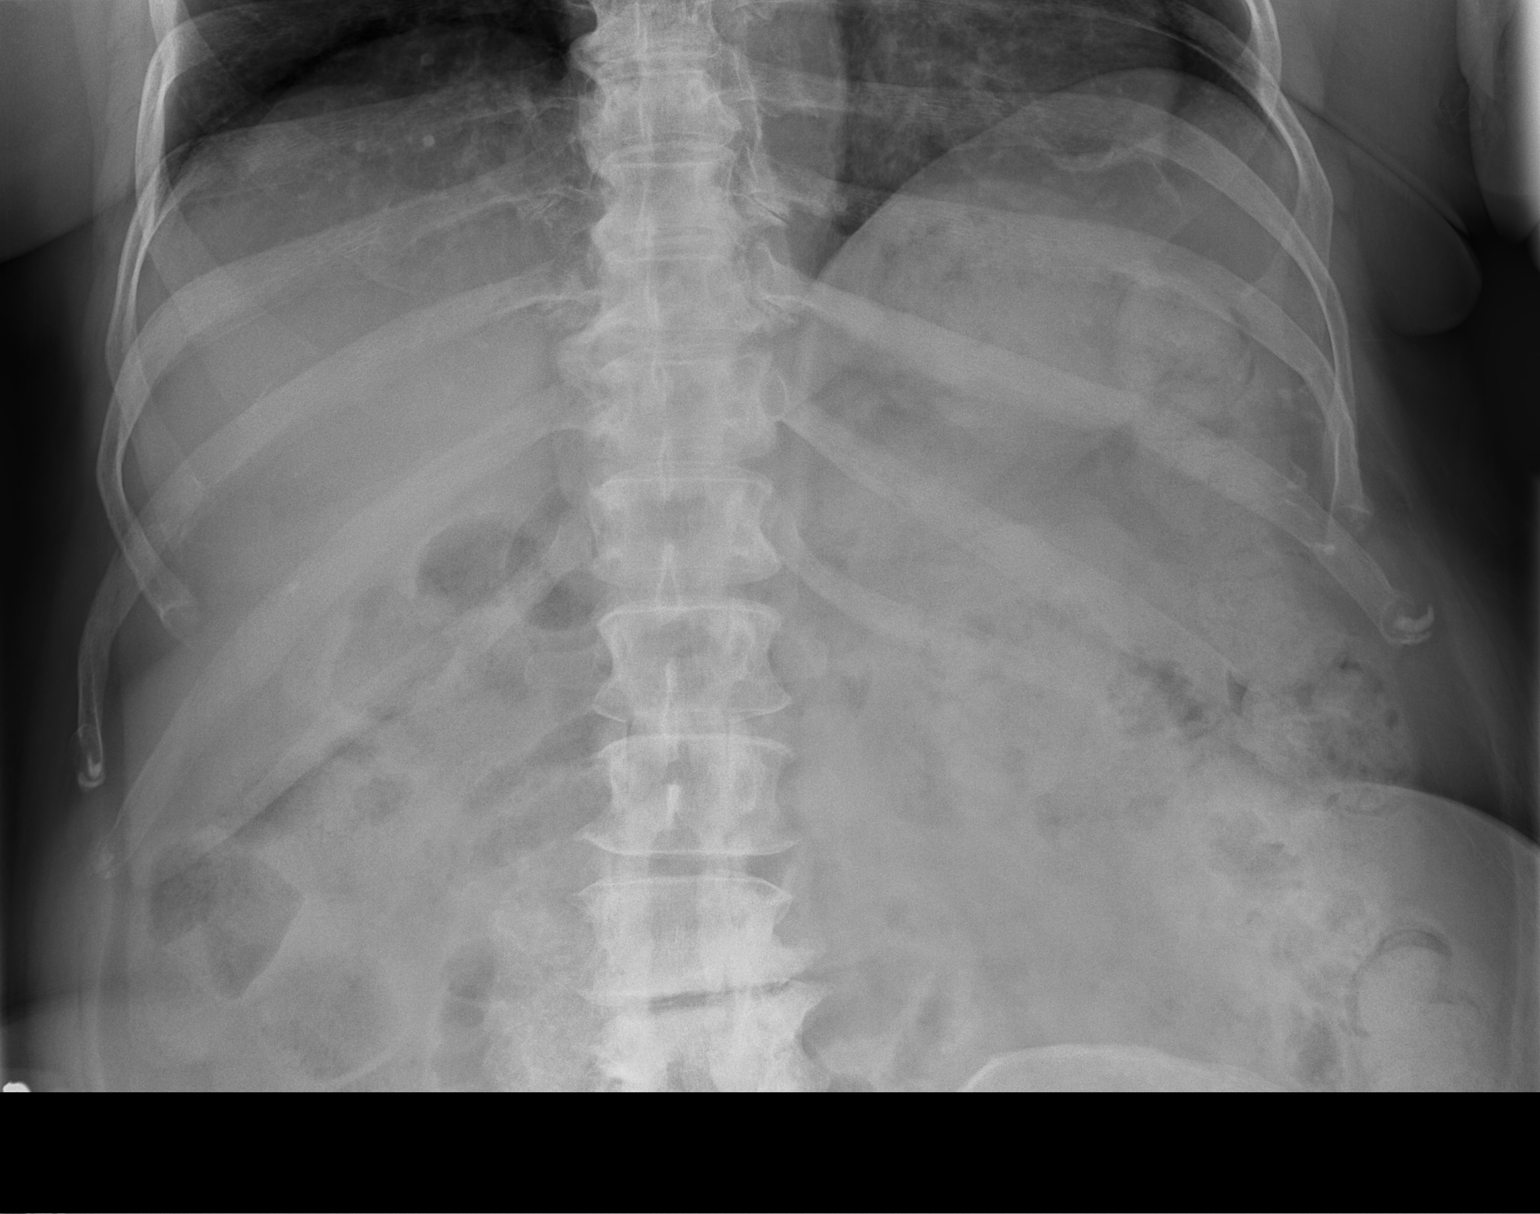

[t abdomen supine (2 of 2)]
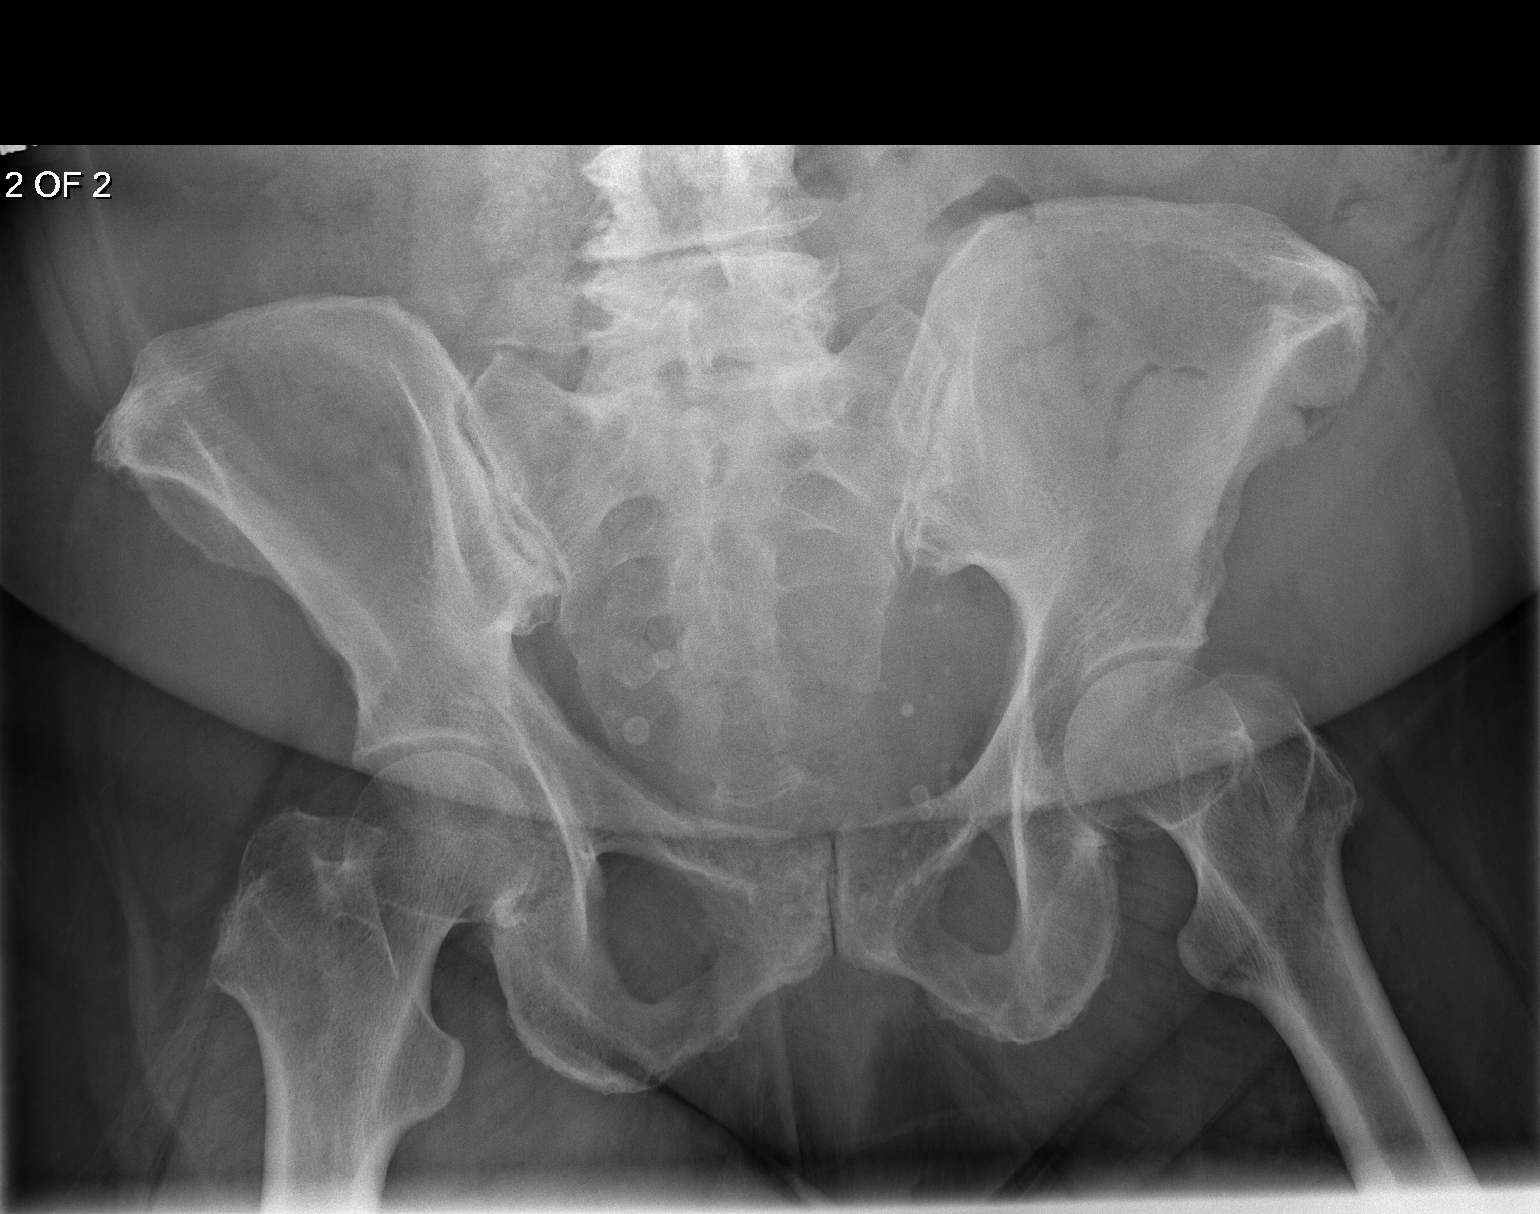

[2 of 2 positions shown; findings below may reference images not displayed]

FINDINGS: Moderate amount of stool is seen in the colon. There is no fecal
impaction in the rectosigmoid. Bowel gas pattern is nonspecific.
Kidneys are partly obscured by bowel contents. Round calcifications
in the pelvis may be vascular. Degenerative changes are noted in the
lumbar spine. There is evidence of laminectomy from L3 to L4 levels.
There is mottled appearance in the soft tissues in the right
paraspinal region at L3-L4 level which may be part of recent
surgery.
IMPRESSION: Nonspecific bowel gas pattern. Lumbar spondylosis. Postsurgical
changes are noted at L3-L4 level.

## 2023-07-25 ENCOUNTER — Other Ambulatory Visit: Payer: Self-pay | Admitting: Family Medicine

## 2023-07-25 DIAGNOSIS — Z1231 Encounter for screening mammogram for malignant neoplasm of breast: Secondary | ICD-10-CM

## 2023-08-18 ENCOUNTER — Ambulatory Visit
Admission: RE | Admit: 2023-08-18 | Discharge: 2023-08-18 | Disposition: A | Discharge status: Home / Self Care | Payer: Medicare Other | Source: Ambulatory Visit | Attending: Family Medicine | Admitting: Family Medicine

## 2023-08-18 DIAGNOSIS — Z1231 Encounter for screening mammogram for malignant neoplasm of breast: Secondary | ICD-10-CM

## 2023-08-21 ENCOUNTER — Other Ambulatory Visit: Payer: Self-pay | Admitting: Orthopedic Surgery

## 2023-08-21 DIAGNOSIS — Z01818 Encounter for other preprocedural examination: Secondary | ICD-10-CM

## 2023-09-03 ENCOUNTER — Other Ambulatory Visit: Payer: Medicare Other

## 2023-09-19 ENCOUNTER — Ambulatory Visit
Admission: RE | Admit: 2023-09-19 | Discharge: 2023-09-19 | Disposition: A | Discharge status: Home / Self Care | Payer: Medicare Other | Source: Ambulatory Visit | Attending: Orthopedic Surgery | Admitting: Orthopedic Surgery

## 2023-09-19 DIAGNOSIS — Z01818 Encounter for other preprocedural examination: Secondary | ICD-10-CM

## 2023-10-12 ENCOUNTER — Ambulatory Visit (HOSPITAL_COMMUNITY)
Admission: EM | Admit: 2023-10-12 | Discharge: 2023-10-12 | Disposition: A | Discharge status: Home / Self Care | Payer: Medicare Other | Attending: Family Medicine | Admitting: Family Medicine

## 2023-10-12 ENCOUNTER — Encounter (HOSPITAL_COMMUNITY): Payer: Self-pay

## 2023-10-12 DIAGNOSIS — J069 Acute upper respiratory infection, unspecified: Secondary | ICD-10-CM | POA: Diagnosis present

## 2023-10-12 DIAGNOSIS — J029 Acute pharyngitis, unspecified: Secondary | ICD-10-CM | POA: Diagnosis not present

## 2023-10-12 LAB — POCT RAPID STREP A (OFFICE): Rapid Strep A Screen: NEGATIVE

## 2023-10-12 NOTE — ED Provider Notes (Signed)
[facility]    CSN: [id number] Arrival date & time: 10/12/23  1001      History   Chief Complaint No chief complaint on file.   HPI Sarah Spencer is a 67 y.o. female.   HPI Here for sore throat and low-grade fever and congestion.  Symptoms began on the evening of December 25.  The highest temperature has been 99.6.  She has had just a little cough but mostly sore throat and feeling like she is swollen on either side of her throat.  No vomiting or diarrhea  She had a point-of-care flu and COVID test done when she had evaluation done on December 27.  She is allergic to cefdinir and cefuroxime, both of which cause itching   Past Medical History:  Diagnosis Date   Allergies    Anxiety    Follows w/ PCP @ [facility], Dr. Sherryll Burger.   Arthritis    knees   Bronchitis    recurrent   COVID-19 11/06/2020   Depression    Follows w/ PCP @ [facility], Dr. Sherryll Burger   Family history of adverse reaction to anesthesia    sister-extreme N&V   GERD (gastroesophageal reflux disease)    Hyperlipidemia    Follows w/ PCP.   Obesity    Osteoporosis    Pedal edema    mild   Pneumonia    as a child and a few times as an adult, hasn't had in years as of 02/11/23   PONV (postoperative nausea and vomiting)    SOBOE (shortness of breath on exertion) 2022   Only when patient had bronchitis. Seen by Dr. Gillermo Murdoch. 08/08/21 TEE was normal EF>55%, mild TR.   Vitamin D deficiency     Patient Active Problem List   Diagnosis Date Noted   Postmenopausal bleeding 03/03/2023   S/P lumbar laminectomy 09/21/2021    Past Surgical History:  Procedure Laterality Date   CERVICAL DISC SURGERY     Dr. Yetta Barre , neurosurgeon.   CESAREAN SECTION  1994   x 1   COLONOSCOPY  2023   ESOPHAGOGASTRODUODENOSCOPY     many years ago as of 02/11/23   LAMINECTOMY WITH POSTERIOR LATERAL ARTHRODESIS LEVEL 3 Bilateral 09/21/2021   Procedure: Laminectomy and Foraminotomy Lumbar Two -  Lumbar Three, Lumbar Three - Lumbar Four, Lumbar Four - Lumbar Five - Bilateral, Posterolateral Fusion Lumbar Two - Lumbar Three, Lumbar Three - Lumbar Four, Lumbar Four - Lumbar Five;  Surgeon: Tia Alert, MD;  Location: [facility] OR;  Service: Neurosurgery;  Laterality: Bilateral;   laparascopy     gyn, 1980's   LAPAROSCOPIC VAGINAL HYSTERECTOMY WITH SALPINGO OOPHORECTOMY Bilateral 03/03/2023   Procedure: LAPAROSCOPIC ASSISTED VAGINAL HYSTERECTOMY WITH SALPINGO OOPHORECTOMY;  Surgeon: Richarda Overlie, MD;  Location: [facility];  Service: Gynecology;  Laterality: Bilateral;   TONSILLECTOMY  1964    OB History   No obstetric history on file.      Home Medications    Prior to Admission medications   Medication Sig Start Date End Date Taking? Authorizing Provider  acetaminophen (TYLENOL) 325 MG tablet Take 650 mg by mouth every 6 (six) hours as needed.    [provider]  acetaminophen (TYLENOL) 325 MG tablet Take 2 tablets (650 mg total) by mouth every 6 (six) hours as needed. 05/09/23   Sloan Leiter, DO  albuterol (VENTOLIN HFA) 108 (90 Base) MCG/ACT inhaler Inhale 2 puffs into the lungs every 6 (six) hours as  needed. 02/11/21  Yes Covington, Maralyn Sago M, PA-C  atorvastatin (LIPITOR) 20 MG tablet Take 20 mg by mouth at bedtime.   Yes [provider]  Cholecalciferol (VITAMIN D3) 50 MCG (2000 UT) TABS Take 2,000 Units by mouth daily.   Yes [provider]  citalopram (CELEXA) 20 MG tablet Take 20 mg by mouth daily.   Yes [provider]  clonazePAM (KLONOPIN) 0.5 MG tablet Take 0.25-0.5 mg by mouth 3 (three) times daily as needed for anxiety. 07/26/20  Yes [provider]  Coenzyme Q10 (COQ10) 100 MG CAPS Take 100 mg by mouth daily.   Yes [provider]  diclofenac (VOLTAREN) 75 MG EC tablet Take 75 mg by mouth daily. 07/06/20   [provider]  famotidine (PEPCID) 20 MG tablet Take 20 mg by mouth 2 (two) times daily.    Yes [provider]  hyoscyamine (LEVSIN SL) 0.125 MG SL tablet Place 1 tablet (0.125 mg total) under the tongue every 4 (four) hours as needed (Abdominal pain and spasms). 09/14/20  Yes Esterwood, Amy S, PA-C  ibuprofen (ADVIL) 600 MG tablet Take 1 tablet (600 mg total) by mouth every 6 (six) hours as needed for moderate pain. 03/04/23   Richarda Overlie, MD  montelukast (SINGULAIR) 10 MG tablet Take 10 mg by mouth at bedtime. 08/15/20  Yes [provider]  niacin 500 MG tablet Take 500 mg by mouth at bedtime.   Yes [provider]  oxyCODONE (OXY IR/ROXICODONE) 5 MG immediate release tablet Take 1-2 tablets (5-10 mg total) by mouth every 4 (four) hours as needed for moderate pain. 03/04/23   Richarda Overlie, MD  oxyCODONE (ROXICODONE) 5 MG immediate release tablet Take 1 tablet (5 mg total) by mouth every 6 (six) hours as needed for severe pain. 05/09/23   Sloan Leiter, DO  Propylene Glycol (SYSTANE COMPLETE) 0.6 % SOLN Place 1 drop into both eyes 2 (two) times daily as needed (dry eyes).   Yes [provider]    Family History Family History  Problem Relation Age of Onset   Heart disease Father    Heart disease Sister    Other Brother        step brother   Heart disease Sister    Stroke Sister        caused by medicine   Colon cancer Neg Hx    Esophageal cancer Neg Hx    Rectal cancer Neg Hx     Social History Social History   Tobacco Use   Smoking status: Never   Smokeless tobacco: Never  Vaping Use   Vaping status: Never Used  Substance Use Topics   Alcohol use: No   Drug use: No     Allergies   Cefuroxime axetil, Bupropion, and Cefdinir   Review of Systems Review of Systems   Physical Exam Triage Vital Signs ED Triage Vitals [10/12/23 1017]  Encounter Vitals Group     BP (!) 147/85     Systolic BP Percentile      Diastolic BP Percentile      Pulse Rate (!) 109     Resp 16     Temp 98 F (36.7 C)     Temp Source Oral      SpO2 95 %     Weight      Height      Head Circumference      Peak Flow      Pain Score  Pain Loc      Pain Education      Exclude from Growth Chart    No data found.  Updated Vital Signs BP (!) 147/85 (BP Location: Left Arm)   Pulse (!) 109   Temp 98 F (36.7 C) (Oral)   Resp 16   SpO2 95%   Visual Acuity Right Eye Distance:   Left Eye Distance:   Bilateral Distance:    Right Eye Near:   Left Eye Near:    Bilateral Near:     Physical Exam Vitals reviewed.  Constitutional:      General: She is not in acute distress.    Appearance: She is not toxic-appearing.  HENT:     Right Ear: Tympanic membrane and ear canal normal.     Left Ear: Tympanic membrane and ear canal normal.     Nose: Congestion present.     Mouth/Throat:     Mouth: Mucous membranes are moist.     Comments: Oropharynx is erythematous with clear mucus draining Eyes:     Extraocular Movements: Extraocular movements intact.     Conjunctiva/sclera: Conjunctivae normal.     Pupils: Pupils are equal, round, and reactive to light.  Cardiovascular:     Rate and Rhythm: Normal rate and regular rhythm.     Heart sounds: No murmur heard. Pulmonary:     Effort: Pulmonary effort is normal. No respiratory distress.     Breath sounds: No stridor. No wheezing, rhonchi or rales.  Musculoskeletal:     Cervical back: Neck supple.  Lymphadenopathy:     Cervical: No cervical adenopathy.  Skin:    Capillary Refill: Capillary refill takes less than 2 seconds.     Coloration: Skin is not jaundiced or pale.  Neurological:     General: No focal deficit present.     Mental Status: She is alert and oriented to person, place, and time.  Psychiatric:        Behavior: Behavior normal.      UC Treatments / Results  Labs (all labs ordered are listed, but only abnormal results are displayed) Labs Reviewed  CULTURE, GROUP A STREP ([facility])  SARS CORONAVIRUS 2 (TAT 6-24 HRS)  POCT RAPID STREP A (OFFICE)     EKG   Radiology No results found.  Procedures Procedures (including critical care time)  Medications Ordered in UC Medications - No data to display  Initial Impression / Assessment and Plan / UC Course  I have reviewed the triage vital signs and the nursing notes.  Pertinent labs & imaging results that were available during my care of the patient were reviewed by me and considered in my medical decision making (see chart for details).     Rapid strep is negative.  Throat culture is sent and we will notify and treat protocol if that is positive  COVID swab is done and we will notify if positive.  If she is positive, she is a candidate for Paxlovid.  Her last EGFR was greater than 60    Final Clinical Impressions(s) / UC Diagnoses   Final diagnoses:  Acute pharyngitis, unspecified etiology  Acute upper respiratory infection     Discharge Instructions      Your strep test is negative.  Culture of the throat will be sent, and staff will notify you if that is in turn positive.   You have been swabbed for COVID, and the test will result in the next 24 hours. Our staff will call you if  positive. If the COVID test is positive, you should quarantine until you are fever free for 24 hours and you are starting to feel better, and then take added precautions for the next 5 days, such as physical distancing/wearing a mask and good hand hygiene/washing.  Take Tylenol as needed for pain or fever.  You can also use Chloraseptic spray for the throat pain.  Make sure you are getting enough fluids and     ED Prescriptions   None    PDMP not reviewed this encounter.   Zenia Resides, MD 10/12/23 [phone]

## 2023-10-12 NOTE — ED Triage Notes (Signed)
Pt reports cough,sore throat,body aches,nasal pressure x 3 days.

## 2023-10-12 NOTE — Discharge Instructions (Signed)
Your strep test is negative.  Culture of the throat will be sent, and staff will notify you if that is in turn positive.   You have been swabbed for COVID, and the test will result in the next 24 hours. Our staff will call you if positive. If the COVID test is positive, you should quarantine until you are fever free for 24 hours and you are starting to feel better, and then take added precautions for the next 5 days, such as physical distancing/wearing a mask and good hand hygiene/washing.  Take Tylenol as needed for pain or fever.  You can also use Chloraseptic spray for the throat pain.  Make sure you are getting enough fluids and

## 2023-10-13 LAB — SARS CORONAVIRUS 2 (TAT 6-24 HRS): SARS Coronavirus 2: NEGATIVE

## 2023-10-15 LAB — CULTURE, GROUP A STREP (THRC)

## 2023-10-30 ENCOUNTER — Ambulatory Visit (HOSPITAL_COMMUNITY)
Admission: EM | Admit: 2023-10-30 | Discharge: 2023-10-30 | Disposition: A | Discharge status: Home / Self Care | Payer: Medicare Other | Attending: Family Medicine | Admitting: Family Medicine

## 2023-10-30 ENCOUNTER — Encounter (HOSPITAL_COMMUNITY): Payer: Self-pay

## 2023-10-30 DIAGNOSIS — J0100 Acute maxillary sinusitis, unspecified: Secondary | ICD-10-CM | POA: Diagnosis not present

## 2023-10-30 MED ORDER — PREDNISONE 20 MG PO TABS: 40.0000 mg | ORAL_TABLET | Freq: Every day | ORAL | 0 refills | Status: AC

## 2023-10-30 NOTE — ED Provider Notes (Signed)
[facility]    CSN: [id number] Arrival date & time: 10/30/23  1155      History   Chief Complaint Chief Complaint  Patient presents with   Cough    HPI Sarah Spencer is a 68 y.o. female.    Cough Associated symptoms: rhinorrhea   Associated symptoms: no chills    Patient is here for cough, sinus infection x 3 weeks.  She was seen 12/29 for pharyngitis, no abx given.  She saw her pcp, dx with sinus infection and given clindamycin on 12/9.  She usually gets an oral steroid for sinus infections, but didn't as she was supposed to have surgery.  Her main symptoms currently are sinus congestion, drainage, sinus pain, pressure.  The glands in her neck are painful.  She is having a cough, but thinks it is from the drainage.  No sob noted.  No fevers/chills currently.   She states she is no longer going to have surgery and would like a steroid pack if possible.   She has two spots at the back of her left hand.  They were draining, but not currently.  Appear dry.       Past Medical History:  Diagnosis Date   Allergies    Anxiety    Follows w/ PCP @ [facility], Dr. Sherryll Burger.   Arthritis    knees   Bronchitis    recurrent   COVID-19 11/06/2020   Depression    Follows w/ PCP @ [facility], Dr. Sherryll Burger   Family history of adverse reaction to anesthesia    sister-extreme N&V   GERD (gastroesophageal reflux disease)    Hyperlipidemia    Follows w/ PCP.   Obesity    Osteoporosis    Pedal edema    mild   Pneumonia    as a child and a few times as an adult, hasn't had in years as of 02/11/23   PONV (postoperative nausea and vomiting)    SOBOE (shortness of breath on exertion) 2022   Only when patient had bronchitis. Seen by Dr. Gillermo Murdoch. 08/08/21 TEE was normal EF>55%, mild TR.   Vitamin D deficiency     Patient Active Problem List   Diagnosis Date Noted   Postmenopausal bleeding 03/03/2023   S/P lumbar laminectomy 09/21/2021    Past  Surgical History:  Procedure Laterality Date   CERVICAL DISC SURGERY     Dr. Yetta Barre , neurosurgeon.   CESAREAN SECTION  1994   x 1   COLONOSCOPY  2023   ESOPHAGOGASTRODUODENOSCOPY     many years ago as of 02/11/23   LAMINECTOMY WITH POSTERIOR LATERAL ARTHRODESIS LEVEL 3 Bilateral 09/21/2021   Procedure: Laminectomy and Foraminotomy Lumbar Two - Lumbar Three, Lumbar Three - Lumbar Four, Lumbar Four - Lumbar Five - Bilateral, Posterolateral Fusion Lumbar Two - Lumbar Three, Lumbar Three - Lumbar Four, Lumbar Four - Lumbar Five;  Surgeon: Tia Alert, MD;  Location: [facility] OR;  Service: Neurosurgery;  Laterality: Bilateral;   laparascopy     gyn, 1980's   LAPAROSCOPIC VAGINAL HYSTERECTOMY WITH SALPINGO OOPHORECTOMY Bilateral 03/03/2023   Procedure: LAPAROSCOPIC ASSISTED VAGINAL HYSTERECTOMY WITH SALPINGO OOPHORECTOMY;  Surgeon: Richarda Overlie, MD;  Location: [facility];  Service: Gynecology;  Laterality: Bilateral;   TONSILLECTOMY  1964    OB History   No obstetric history on file.      Home Medications    Prior to Admission medications   Medication Sig Start Date End  Date Taking? Authorizing Provider  clindamycin (CLEOCIN) 300 MG capsule Take 300 mg by mouth every 8 (eight) hours. 10/23/23  Yes [provider]  acetaminophen (TYLENOL) 325 MG tablet Take 650 mg by mouth every 6 (six) hours as needed.   Yes [provider]  albuterol (VENTOLIN HFA) 108 (90 Base) MCG/ACT inhaler Inhale 2 puffs into the lungs every 6 (six) hours as needed. 02/11/21   Rushie Chestnut, PA-C  atorvastatin (LIPITOR) 20 MG tablet Take 20 mg by mouth at bedtime.   Yes [provider]  Cholecalciferol (VITAMIN D3) 50 MCG (2000 UT) TABS Take 2,000 Units by mouth daily.   Yes [provider]  citalopram (CELEXA) 20 MG tablet Take 20 mg by mouth daily.   Yes [provider]  clonazePAM (KLONOPIN) 0.5 MG tablet Take 0.25-0.5 mg by mouth 3 (three) times daily  as needed for anxiety. 07/26/20  Yes [provider]  Coenzyme Q10 (COQ10) 100 MG CAPS Take 100 mg by mouth daily.   Yes [provider]  diclofenac (VOLTAREN) 75 MG EC tablet Take 75 mg by mouth daily. 07/06/20  Yes [provider]  famotidine (PEPCID) 20 MG tablet Take 20 mg by mouth 2 (two) times daily.   Yes [provider]  montelukast (SINGULAIR) 10 MG tablet Take 10 mg by mouth at bedtime. 08/15/20  Yes [provider]  niacin 500 MG tablet Take 500 mg by mouth at bedtime.    [provider]  Propylene Glycol (SYSTANE COMPLETE) 0.6 % SOLN Place 1 drop into both eyes 2 (two) times daily as needed (dry eyes).   Yes [provider]    Family History Family History  Problem Relation Age of Onset   Heart disease Father    Heart disease Sister    Other Brother        step brother   Heart disease Sister    Stroke Sister        caused by medicine   Colon cancer Neg Hx    Esophageal cancer Neg Hx    Rectal cancer Neg Hx     Social History Social History   Tobacco Use   Smoking status: Never   Smokeless tobacco: Never  Vaping Use   Vaping status: Never Used  Substance Use Topics   Alcohol use: No   Drug use: No     Allergies   Cefuroxime axetil, Bupropion, and Cefdinir   Review of Systems Review of Systems  Constitutional:  Negative for chills and fatigue.  HENT:  Positive for congestion, rhinorrhea, sinus pressure and sinus pain.   Respiratory:  Positive for cough.   Cardiovascular: Negative.   Gastrointestinal: Negative.   Genitourinary: Negative.   Musculoskeletal: Negative.   Psychiatric/Behavioral: Negative.       Physical Exam Triage Vital Signs ED Triage Vitals  Encounter Vitals Group     BP 10/30/23 1417 133/80     Systolic BP Percentile --      Diastolic BP Percentile --      Pulse Rate 10/30/23 1417 87     Resp 10/30/23 1417 18     Temp 10/30/23 1417 98.1 F (36.7 C)     Temp Source  10/30/23 1417 Oral     SpO2 10/30/23 1417 96 %     Weight 10/30/23 1417 252 lb (114.3 kg)     Height 10/30/23 1417 5\' 5"  (1.651 m)     Head Circumference --      Peak  Flow --      Pain Score 10/30/23 1414 10     Pain Loc --      Pain Education --      Exclude from Growth Chart --    No data found.  Updated Vital Signs BP 133/80 (BP Location: Left Arm)   Pulse 87   Temp 98.1 F (36.7 C) (Oral)   Resp 18   Ht 5\' 5"  (1.651 m)   Wt 114.3 kg   SpO2 96%   BMI 41.93 kg/m   Visual Acuity Right Eye Distance:   Left Eye Distance:   Bilateral Distance:    Right Eye Near:   Left Eye Near:    Bilateral Near:     Physical Exam Constitutional:      General: She is not in acute distress.    Appearance: Normal appearance. She is normal weight. She is not ill-appearing or toxic-appearing.  HENT:     Nose: Congestion and rhinorrhea present.     Right Sinus: Maxillary sinus tenderness and frontal sinus tenderness present.     Left Sinus: Maxillary sinus tenderness and frontal sinus tenderness present.     Mouth/Throat:     Mouth: Mucous membranes are moist.     Pharynx: No posterior oropharyngeal erythema.  Cardiovascular:     Rate and Rhythm: Normal rate and regular rhythm.  Pulmonary:     Effort: Pulmonary effort is normal.     Breath sounds: Normal breath sounds.  Musculoskeletal:        General: Normal range of motion.     Cervical back: Normal range of motion and neck supple. Tenderness present.  Lymphadenopathy:     Cervical: Cervical adenopathy present.  Skin:    General: Skin is warm.  Neurological:     General: No focal deficit present.     Mental Status: She is alert.  Psychiatric:        Mood and Affect: Mood normal.      UC Treatments / Results  Labs (all labs ordered are listed, but only abnormal results are displayed) Labs Reviewed - No data to display  EKG   Radiology No results found.  Procedures Procedures (including critical care  time)  Medications Ordered in UC Medications - No data to display  Initial Impression / Assessment and Plan / UC Course  I have reviewed the triage vital signs and the nursing notes.  Pertinent labs & imaging results that were available during my care of the patient were reviewed by me and considered in my medical decision making (see chart for details).   Final Clinical Impressions(s) / UC Diagnoses   Final diagnoses:  Acute non-recurrent maxillary sinusitis     Discharge Instructions      You were seen today for sinusitis.  I have sent out a 5 day course of oral prednisone to take along with your antibiotic.  Please follow up here or with your primary care provider if you are not improving or worsening.     ED Prescriptions     Medication Sig Dispense Auth. Provider   predniSONE (DELTASONE) 20 MG tablet Take 2 tablets (40 mg total) by mouth daily for 5 days. 10 tablet Jannifer Franklin, MD      PDMP not reviewed this encounter.   Jannifer Franklin, MD 10/30/23 1436

## 2023-10-30 NOTE — Discharge Instructions (Signed)
You were seen today for sinusitis.  I have sent out a 5 day course of oral prednisone to take along with your antibiotic.  Please follow up here or with your primary care provider if you are not improving or worsening.

## 2023-10-30 NOTE — ED Notes (Signed)
No answer when called for triage 

## 2023-10-30 NOTE — ED Triage Notes (Signed)
Ongoing URI x3 weeks. Patient was seen by her PCP and given an abx Clindamycin and currently still taking for the last 7 days. Still having nasal congestion, neck pain/stiffness, SOB, productive cough with green mucus at times tinged with blood, and sneezing. Also having sore spots on the hands.   No known sick exposure and no one with similar symptoms. States the flu is going around at her church but she hasn't been.

## 2023-11-05 ENCOUNTER — Ambulatory Visit: Payer: Medicare Other | Admitting: Physician Assistant

## 2024-01-14 NOTE — Progress Notes (Unsigned)
 Cardiology Office Note:  .   Date:  01/15/2024  ID:  Sarah Spencer, DOB [birth year], MRN [id number] PCP: [facility], [facility] (Inactive)  [facility] Providers Cardiologist:  None { History of Present Illness: .   Sarah Spencer is a 68 y.o. female with a past medical history of postmenopausal bleeding needing OB/GYN surgery with anesthesia, GERD, obesity, depression/anxiety, HLD here for follow-up appointment.  No CAD, valve disease, or CHF noted.  History includes family with (father) he premature CAD and sister with CVA and renal failure.  Seen at the [facility] clinic for dyspnea 07/19/2021.  TTE was normal with LVEF greater than 55%, mild TR.  Indicated that her dyspnea was from prolonged bronchitis and resolved when this was better so she never followed up with cardiology.  Recent sinusitis and ear infection with antibiotics given.  Symptoms were better.  Widowed with 2 daughters and 1 lives with her own ones in [place].  She was seen a year ago and needed a hysterectomy with Dr. Marcelle Overlie for bleeding.  She retired from [employer] he can do 6 months without any issue.  She was ultimately cleared for surgery.  Today, the patient with a history of heart palpitations experiencing these infrequently, approximately a couple of times a month. She is not particularly bothersome and does not cause any associated symptoms such as lightheadedness or dizziness.  The patient is due to have shoulder surgery, the timing of which is currently unknown. She has been experiencing significant knee pain, which has greatly reduced her mobility and activity level. She reports difficulty in walking, climbing stairs, and performing household chores. The knee pain is so severe that it affects her ability to get in and out of a car.  The patient has a history of successful hysterectomy, which she reports was an easy process with minimal pain and complications.  The patient has also expressed concern about  her weight. Despite reducing her food intake to one meal a day, she has not seen any significant weight loss. She is considering weight loss medication and is in discussion with her primary care provider about this.  Reports no shortness of breath nor dyspnea on exertion. Reports no chest pain, pressure, or tightness. No edema, orthopnea, PND. Reports no palpitations.   Discussed the use of AI scribe software for clinical note transcription with the patient, who gave verbal consent to proceed.   ROS: pertinent ROS in HPI  Studies Reviewed: Marland Kitchen   EKG Interpretation Date/Time:  Thursday January 15 2024 15:42:20 EDT Ventricular Rate:  82 PR Interval:  146 QRS Duration:  82 QT Interval:  376 QTC Calculation: 439 R Axis:   -19  Text Interpretation: Sinus rhythm with Premature atrial complexes Inferior infarct , age undetermined Cannot rule out Anterior infarct , age undetermined When compared with ECG of 09-May-2023 22:32, PREVIOUS ECG IS PRESENT Confirmed by Jari Favre [phone]) on 01/15/2024 5:20:44 PM       Physical Exam:   VS:  BP 138/70   Pulse 82   Ht 5\' 5"  (1.651 m)   Wt 255 lb 3.2 oz (115.8 kg)   SpO2 97%   BMI 42.47 kg/m    Wt Readings from Last 3 Encounters:  01/15/24 255 lb 3.2 oz (115.8 kg)  10/30/23 252 lb (114.3 kg)  05/09/23 252 lb (114.3 kg)    GEN: Well nourished, well developed in no acute distress NECK: No JVD; No carotid bruits CARDIAC: RRR, no murmurs, rubs, gallops RESPIRATORY:  Clear to auscultation without rales, wheezing or rhonchi  ABDOMEN: Soft, non-tender, non-distended EXTREMITIES:  No edema; No deformity   ASSESSMENT AND PLAN: .    Preop clearance  [name]'s perioperative risk of a major cardiac event is 0.4% according to the Revised Cardiac Risk Index (RCRI).  Therefore, she is at low risk for perioperative complications.   Her functional capacity is poor at 3.63 METs according to the Duke Activity Status Index (DASI). Recommendations: According  to ACC/AHA guidelines, no further cardiovascular testing needed.  The patient may proceed to surgery at acceptable risk.    Palpitations Intermittent palpitations not frequent enough to warrant further investigation. - Monitor symptoms and report if frequency increases or if associated symptoms develop.  Knee Osteoarthritis Severe osteoarthritis with bone-on-bone changes, limiting mobility and daily activities. - Consider knee replacement surgery after shoulder surgery.  Obesity BMI 42. Goal to reduce BMI below 40 for knee surgery qualification. Discussed weight loss medications safe for cardiac patients. - Discuss potential weight loss medications like Wegovy or Mounjaro with primary care provider. - Encourage dietary modifications and increased physical activity as tolerated.  Hyperlipidemia Managed with atorvastatin. Recent swelling due to erythromycin. Lipid panel needs updating. - Order lipid panel.     Dispo: She can follow-up in a year with me  Signed, Sharlene Dory, PA-C

## 2024-01-15 ENCOUNTER — Ambulatory Visit: Payer: Medicare Other | Attending: Physician Assistant | Admitting: Physician Assistant

## 2024-01-15 ENCOUNTER — Encounter: Payer: Self-pay | Admitting: Physician Assistant

## 2024-01-15 VITALS — BP 138/70 | HR 82 | Ht 65.0 in | Wt 255.2 lb

## 2024-01-15 DIAGNOSIS — R0609 Other forms of dyspnea: Secondary | ICD-10-CM

## 2024-01-15 DIAGNOSIS — K2100 Gastro-esophageal reflux disease with esophagitis, without bleeding: Secondary | ICD-10-CM

## 2024-01-15 DIAGNOSIS — Z01810 Encounter for preprocedural cardiovascular examination: Secondary | ICD-10-CM

## 2024-01-15 DIAGNOSIS — E785 Hyperlipidemia, unspecified: Secondary | ICD-10-CM

## 2024-01-15 DIAGNOSIS — Z01818 Encounter for other preprocedural examination: Secondary | ICD-10-CM

## 2024-01-15 NOTE — Patient Instructions (Signed)
 Medication Instructions:   Your physician recommends that you continue on your current medications as directed. Please refer to the Current Medication list given to you today.  *If you need a refill on your cardiac medications before your next appointment, please call your pharmacy*  Lab Work:  ANYTIME SOON AT ANY [facility] NEAR YOU--CHECK LIPIDS--PLEASE GO FASTING TO THIS LAB APPOINTMENT  If you have labs (blood work) drawn today and your tests are completely normal, you will receive your results only by: MyChart Message (if you have MyChart) OR A paper copy in the mail If you have any lab test that is abnormal or we need to change your treatment, we will call you to review the results.    Follow-Up: At [facility], you and your health needs are our priority.  As part of our continuing mission to provide you with exceptional heart care, our providers are all part of one team.  This team includes your primary Cardiologist (physician) and Advanced Practice Providers or APPs (Physician Assistants and Nurse Practitioners) who all work together to provide you with the care you need, when you need it.  Your next appointment:   1 year(s)  Provider:   DR. Eden Emms    Other Instructions  [facility] Contact for Alternative locations and appointment scheduling   [email or web address]   [email or web address]  [phone]           1st Floor: - Lobby - Registration  - Pharmacy  - Lab - Cafe  2nd Floor: - PV Lab - Diagnostic Testing (echo, CT, nuclear med)  3rd Floor: - Vacant  4th Floor: - TCTS (cardiothoracic surgery) - AFib Clinic - Structural Heart Clinic - Vascular Surgery  - Vascular Ultrasound  5th Floor: - HeartCare Cardiology (general and EP) - Clinical Pharmacy for coumadin, hypertension, lipid, weight-loss medications, and med management appointments    Valet parking services will be available as well.

## 2024-01-23 ENCOUNTER — Telehealth: Payer: Self-pay | Admitting: *Deleted

## 2024-01-23 DIAGNOSIS — E785 Hyperlipidemia, unspecified: Secondary | ICD-10-CM

## 2024-01-23 DIAGNOSIS — Z79899 Other long term (current) drug therapy: Secondary | ICD-10-CM

## 2024-01-23 LAB — LIPID PANEL
Chol/HDL Ratio: 3.8 ratio (ref 0.0–4.4)
Cholesterol, Total: 179 mg/dL (ref 100–199)
HDL: 47 mg/dL (ref >39)
LDL Chol Calc (NIH): 109 mg/dL — ABNORMAL HIGH (ref 0–99)
Triglycerides: 126 mg/dL (ref 0–149)
VLDL Cholesterol Cal: 23 mg/dL (ref 5–40)

## 2024-01-23 MED ORDER — ATORVASTATIN CALCIUM 40 MG PO TABS: 40.0000 mg | ORAL_TABLET | Freq: Every day | ORAL | 2 refills | Status: AC

## 2024-01-23 NOTE — Telephone Encounter (Signed)
-----   Message from Alver Sorrow sent at 01/23/2024  4:37 PM EDT ----- LDL (bad cholesterol) of 109. Given aortic atherosclerosis by prior CT 2022, would recommend LDL goal <70. Recommend increase Atorvastatin to 40mg  daily with repeat FLP/LFT in 3 months. Recommend aiming for 150 minutes of moderate intensity activity per week and following a heart healthy diet.

## 2024-01-23 NOTE — Telephone Encounter (Signed)
 The patient has been notified of the result and verbalized understanding.  All questions (if any) were answered.  Pt aware that per Gillian Shields NP (covering for [facility] PA-C while on maternity leave) we will increase her atorvastatin to 40 mg po daily and repeat lipids/LFTs in 3 months.   Confirmed the pharmacy of choice with the pt.  Pt aware I will place the order for repeat lipids/LFTs in 3 months in the system and release this.  She is aware to mark this down on her calendar for mid-July to have this done.  Pt aware to come fasting to this lab appt and I will mail the lab requisition forms to her mailing address.   Pt education provided to her about aiming for 150 mins of moderate intensity activity per week and heart healthy diet.  Pt verbalized understanding and agrees with this plan.

## 2024-03-11 ENCOUNTER — Emergency Department (HOSPITAL_COMMUNITY)
Admission: EM | Admit: 2024-03-11 | Discharge: 2024-03-11 | Disposition: A | Discharge status: Home / Self Care | Attending: Emergency Medicine | Admitting: Emergency Medicine

## 2024-03-11 ENCOUNTER — Emergency Department (HOSPITAL_COMMUNITY)

## 2024-03-11 ENCOUNTER — Encounter (HOSPITAL_COMMUNITY): Payer: Self-pay

## 2024-03-11 ENCOUNTER — Other Ambulatory Visit: Payer: Self-pay

## 2024-03-11 DIAGNOSIS — R000 Tachycardia, unspecified: Secondary | ICD-10-CM | POA: Insufficient documentation

## 2024-03-11 DIAGNOSIS — Z8616 Personal history of COVID-19: Secondary | ICD-10-CM | POA: Insufficient documentation

## 2024-03-11 DIAGNOSIS — D72829 Elevated white blood cell count, unspecified: Secondary | ICD-10-CM | POA: Insufficient documentation

## 2024-03-11 LAB — COMPREHENSIVE METABOLIC PANEL WITH GFR
ALT: 21 U/L (ref 0–44)
AST: 29 U/L (ref 15–41)
Albumin: 3.2 g/dL — ABNORMAL LOW (ref 3.5–5.0)
Alkaline Phosphatase: 64 U/L (ref 38–126)
Anion gap: 12 (ref 5–15)
BUN: 12 mg/dL (ref 8–23)
CO2: 20 mmol/L — ABNORMAL LOW (ref 22–32)
Calcium: 8.2 mg/dL — ABNORMAL LOW (ref 8.9–10.3)
Chloride: 105 mmol/L (ref 98–111)
Creatinine, Ser: 0.76 mg/dL (ref 0.44–1.00)
GFR, Estimated: >60 mL/min (ref >60)
Glucose, Bld: 163 mg/dL — ABNORMAL HIGH (ref 70–99)
Potassium: 3.3 mmol/L — ABNORMAL LOW (ref 3.5–5.1)
Sodium: 137 mmol/L (ref 135–145)
Total Bilirubin: 0.7 mg/dL (ref 0.0–1.2)
Total Protein: 5.9 g/dL — ABNORMAL LOW (ref 6.5–8.1)

## 2024-03-11 LAB — CBC WITH DIFFERENTIAL/PLATELET
Abs Immature Granulocytes: 0.07 10*3/uL (ref 0.00–0.07)
Basophils Absolute: 0 10*3/uL (ref 0.0–0.1)
Basophils Relative: 0 %
Eosinophils Absolute: 0 10*3/uL (ref 0.0–0.5)
Eosinophils Relative: 0 %
HCT: 37.2 % (ref 36.0–46.0)
Hemoglobin: 12.2 g/dL (ref 12.0–15.0)
Immature Granulocytes: 1 %
Lymphocytes Relative: 4 %
Lymphs Abs: 0.5 10*3/uL — ABNORMAL LOW (ref 0.7–4.0)
MCH: 29.5 pg (ref 26.0–34.0)
MCHC: 32.8 g/dL (ref 30.0–36.0)
MCV: 89.9 fL (ref 80.0–100.0)
Monocytes Absolute: 0.2 10*3/uL (ref 0.1–1.0)
Monocytes Relative: 2 %
Neutro Abs: 13.6 10*3/uL — ABNORMAL HIGH (ref 1.7–7.7)
Neutrophils Relative %: 93 %
Platelets: 299 10*3/uL (ref 150–400)
RBC: 4.14 MIL/uL (ref 3.87–5.11)
RDW: 14.6 % (ref 11.5–15.5)
WBC: 14.4 10*3/uL — ABNORMAL HIGH (ref 4.0–10.5)
nRBC: 0 % (ref 0.0–0.2)

## 2024-03-11 LAB — PHOSPHORUS: Phosphorus: 2.8 mg/dL (ref 2.5–4.6)

## 2024-03-11 LAB — MAGNESIUM: Magnesium: 2.6 mg/dL — ABNORMAL HIGH (ref 1.7–2.4)

## 2024-03-11 MED ORDER — LACTATED RINGERS IV BOLUS
500.0000 mL | Freq: Once | INTRAVENOUS | Status: AC
  Administered 2024-03-11: 500 mL via INTRAVENOUS

## 2024-03-11 MED ORDER — POTASSIUM CHLORIDE 10 MEQ/100ML IV SOLN
10.0000 meq | Freq: Once | INTRAVENOUS | Status: AC
  Administered 2024-03-11: 10 meq via INTRAVENOUS
  Filled 2024-03-11: qty 100

## 2024-03-11 MED ORDER — OXYCODONE HCL 5 MG PO TABS
5.0000 mg | ORAL_TABLET | Freq: Once | ORAL | Status: AC
  Administered 2024-03-11: 5 mg via ORAL
  Filled 2024-03-11: qty 1

## 2024-03-11 MED ORDER — FENTANYL CITRATE PF 50 MCG/ML IJ SOSY
50.0000 ug | PREFILLED_SYRINGE | Freq: Once | INTRAMUSCULAR | Status: AC
  Administered 2024-03-11: 50 ug via INTRAVENOUS
  Filled 2024-03-11: qty 1

## 2024-03-11 MED ORDER — POTASSIUM CHLORIDE CRYS ER 20 MEQ PO TBCR
40.0000 meq | EXTENDED_RELEASE_TABLET | Freq: Once | ORAL | Status: AC
  Administered 2024-03-11: 40 meq via ORAL
  Filled 2024-03-11: qty 2

## 2024-03-11 NOTE — ED Provider Notes (Signed)
 Sarah Spencer EMERGENCY DEPARTMENT AT [facility] Provider Note  History  Chief Complaint:  Tachycardia  The history is provided by the patient.     Sarah Spencer is a 68 y.o. female with a history of left shoulder replacement this afternoon at [facility] surgical center who presents the emergency department due to concerns for tachycardia.  She reports that she visited a cardiologist prior to surgery and they cleared her for surgery.  She mentions that she may have had an arrhythmia however does not remember the name.  She states that the surgery went well per the surgeon.  She reports that when she woke up she did have mild shortness of breath.  They sent her over here given persistent tachycardia.  She reports currently that her pain is well-controlled.  She reports no chest pain, shortness of breath, leg swelling.  She has no abdominal pain nausea or vomiting.  States she has never had a DVT/PE.  On no blood thinners.  Past Medical History:  Diagnosis Date   Allergies    Anxiety    Follows w/ PCP @ [facility], Dr. Mason Sole.   Arthritis    knees   Bronchitis    recurrent   COVID-19 11/06/2020   Depression    Follows w/ PCP @ [facility], Dr. Mason Sole   Family history of adverse reaction to anesthesia    sister-extreme N&V   GERD (gastroesophageal reflux disease)    Hyperlipidemia    Follows w/ PCP.   Obesity    Osteoporosis    Pedal edema    mild   Pneumonia    as a child and a few times as an adult, hasn't had in years as of 02/11/23   PONV (postoperative nausea and vomiting)    SOBOE (shortness of breath on exertion) 2022   Only when patient had bronchitis. Seen by Dr. Paraschos@ [facility]. 08/08/21 TEE was normal EF>55%, mild TR.   Vitamin D deficiency     Past Surgical History:  Procedure Laterality Date   CERVICAL DISC SURGERY     Dr. Rochelle Chu , neurosurgeon.   CESAREAN SECTION  1994   x 1   COLONOSCOPY  2023   ESOPHAGOGASTRODUODENOSCOPY     many  years ago as of 02/11/23   LAMINECTOMY WITH POSTERIOR LATERAL ARTHRODESIS LEVEL 3 Bilateral 09/21/2021   Procedure: Laminectomy and Foraminotomy Lumbar Two - Lumbar Three, Lumbar Three - Lumbar Four, Lumbar Four - Lumbar Five - Bilateral, Posterolateral Fusion Lumbar Two - Lumbar Three, Lumbar Three - Lumbar Four, Lumbar Four - Lumbar Five;  Surgeon: Isadora Mar, MD;  Location: [facility] OR;  Service: Neurosurgery;  Laterality: Bilateral;   laparascopy     gyn, 1980's   LAPAROSCOPIC VAGINAL HYSTERECTOMY WITH SALPINGO OOPHORECTOMY Bilateral 03/03/2023   Procedure: LAPAROSCOPIC ASSISTED VAGINAL HYSTERECTOMY WITH SALPINGO OOPHORECTOMY;  Surgeon: Woodrow Hazy, MD;  Location: [facility];  Service: Gynecology;  Laterality: Bilateral;   TONSILLECTOMY  1964   TOTAL SHOULDER REPLACEMENT Left     Family History  Problem Relation Age of Onset   Heart disease Father    Heart disease Sister    Other Brother        step brother   Heart disease Sister    Stroke Sister        caused by medicine   Colon cancer Neg Hx    Esophageal cancer Neg Hx    Rectal cancer Neg Hx     Social History  Tobacco Use   Smoking status: Never   Smokeless tobacco: Never  Vaping Use   Vaping status: Never Used  Substance Use Topics   Alcohol use: No   Drug use: No    Review of Systems  Review of Systems   Reviewed and documented in HPI if pertinent.   Physical Exam   ED Triage Vitals  Encounter Vitals Group     BP 03/11/24 1759 128/68     Systolic BP Percentile --      Diastolic BP Percentile --      Pulse Rate 03/11/24 1759 (!) 115     Resp 03/11/24 1759 15     Temp 03/11/24 1759 98 F (36.7 C)     Temp Source 03/11/24 1759 Oral     SpO2 03/11/24 1759 95 %     Weight 03/11/24 1804 260 lb (117.9 kg)     Height 03/11/24 1804 5\' 5"  (1.651 m)     Head Circumference --      Peak Flow --      Pain Score --      Pain Loc --      Pain Education --      Exclude from Growth Chart --       Physical Exam Vitals and nursing note reviewed.  Constitutional:      General: She is not in acute distress.    Appearance: She is well-developed.  HENT:     Head: Normocephalic and atraumatic.  Eyes:     Conjunctiva/sclera: Conjunctivae normal.  Cardiovascular:     Rate and Rhythm: Regular rhythm. Tachycardia present.     Heart sounds: No murmur heard. Pulmonary:     Effort: Pulmonary effort is normal. No respiratory distress.     Breath sounds: Normal breath sounds. No wheezing, rhonchi or rales.  Abdominal:     General: There is no distension.     Palpations: Abdomen is soft.     Tenderness: There is no abdominal tenderness.  Musculoskeletal:        General: No swelling.     Cervical back: Neck supple.     Comments: L shoulder incision with dressing in place that is clean, dry and intact 2+ radial pulse Iodine  on extremity  Skin:    General: Skin is warm and dry.     Capillary Refill: Capillary refill takes less than 2 seconds.  Neurological:     General: No focal deficit present.     Mental Status: She is alert and oriented to person, place, and time.  Psychiatric:        Mood and Affect: Mood normal.      Procedures   Procedures  ED Course - Medical Decision Making  Brief Overview Sarah Spencer is a 68 y.o. female who presents as per above.  I have reviewed the nursing documentation for past medical history, family history, and social history and agree.  I have reviewed the patient's vital signs. Tachycardia.   Initial Differential Diagnoses: I am primarily concerned for electrolyte imbalance, arrhythmia, postoperative pain, anemia, atrial fibrillation, atrial flutter.  Therapies: These medications and interventions were provided for the patient while in the ED.  Medications  lactated ringers  bolus 500 mL (0 mLs Intravenous Stopped 03/11/24 2021)  fentaNYL  (SUBLIMAZE ) injection 50 mcg (50 mcg Intravenous Given 03/11/24 2011)  potassium chloride  10  mEq in 100 mL IVPB (10 mEq Intravenous New Bag/Given 03/11/24 2015)  potassium chloride  SA (KLOR-CON  M) CR tablet 40 mEq (40 mEq  Oral Given 03/11/24 2012)    Testing Results: On my interpretation labs are significant for : Magnesium 2.6, got magnesium in route Mild leukocytosis, likely reactive in the setting of recent surgery   On my interpretation imaging is significant for: Chest x-ray without focal opacities suggest pneumonia does reveal atelectasis left lower lobe  EKG Interpretation Date/Time:  Thursday Mar 11 2024 17:57:58 EDT Ventricular Rate:  116 PR Interval:  155 QRS Duration:  92 QT Interval:  371 QTC Calculation: 516 R Axis:   -41  Text Interpretation: Sinus tachycardia Left axis deviation Abnormal R-wave progression, late transition Borderline T abnormalities, anterior leads Prolonged QT interval Confirmed by Lowery Rue [phone]) on 03/11/2024 5:59:23 PM   See the EMR for full details regarding lab and imaging results.   Medical Decision Making 68 year old female who presents the emergency department for tachycardia.  She was at the surgical center for a left total shoulder arthroplasty.  There they were concerned about an arrhythmia so they sent her here.  On exam patient does have tachycardia with a normal blood pressure.  EKG here reveals sinus rhythm.  Initial concern is for this is likely related to anesthesia.  However we will evaluate for electrolyte abnormalities.  Electrolytes are within normal limits.  Patient does have elevated magnesium however did receive this prior to arrival.  Patient's potassium is slightly decreased however I do not feel this is likely a causative agent.  Therefore we will replete.  Patient does not appear dehydrated however we will give additional fluids to ensure that she is well-hydrated.  Patient has no shortness of breath, pleuritic chest pain.  I suspect that this is likely related to anesthesia given above findings.  Patient states  she would like to go home.  Feel this is reasonable.  We instructed her to follow with her primary care doctor as well as her surgeon.  She will follow all surgeon recommendations for the postoperative period.  Amount and/or Complexity of Data Reviewed Labs: ordered. Radiology: ordered.  Risk Prescription drug management.     ### All radiography studies, electrocardiograms, and laboratory data were personally reviewed by me and incorporated into my medical decision making. Impression   1. Tachycardia      Note: Chief Executive Officer was used in the creation of this note.     Arminda Landmark, MD 03/11/24 2219    Lowery Rue, DO 03/11/24 2224

## 2024-03-11 NOTE — ED Triage Notes (Signed)
 Pt BIB [facility] from [facility] with c/o tachycardia in 120s. Pt had L shoulder replacement this aftermoon, per MD at center procedure went well, but they believe nerve block did not take. Per EMS pt was given 2mg  IV mag in 500mL LR at the center. Pt is not complaining of pain att. All other VSS per EMS.

## 2024-03-11 NOTE — Discharge Instructions (Addendum)
 Glynda Lash:  Thank you for allowing us  to take care of you today.  We hope you begin feeling better soon. You were seen today for postoperative tachycardia.  It is likely that you had an arrhythmia in the postsurgical period.  We do not think you need anticoagulation at this time.  Please follow-up with your primary care physician in the morning to schedule a visit.  Please take all pain medications as prescribed by your surgeon.  Please follow-up with them as your regular scheduled appointments.   To-Do:  Please follow-up with your primary doctor within the next 2-3 days. It is important that you review any labs or imaging results (if any) that you had today with them. Your preliminary imaging results (if any) are attached. Please return to the Emergency Department or call 911 if you experience chest pain, shortness of breath, severe pain, severe fever, altered mental status, or have any reason to think that you need emergency medical care.  Thank you again.  Hope you feel better soon.  Arminda Landmark, MD Department of Emergency Medicine

## 2024-03-11 NOTE — ED Provider Notes (Signed)
 Supervised visit.  Patient here from surgical center with fast heart rate.  Looked like she was in some sort of junctional rhythm question if may be A-fib rhythm on EKG strips brought by EMS.  However she is in a sinus tachycardia now on my review interpretation of EKG.  She is having left shoulder replacement today.  She is having some discomfort in the shoulder but otherwise denies any nausea vomiting chest pain shortness of breath.  She is mildly tachycardic here at 108.  She has no fever.  Lab works unremarkable.  Overall we will give her some IV fluids IV pain medicine check electrolytes.  I suspect that arrhythmia was likely secondary to anesthesia.  Potassium is a little bit low at 3.3 will replete both IV and oral.  Lab work otherwise unremarkable.  Chest x-ray does not show any obvious pneumonia or pneumothorax per my read interpretation.  Overall heart rate has improved.  This is in the low 100s.  No evidence of A-fib or other irregular heart rate at this time.  She understands return precautions.  Discharged in good condition.  I suspect mild tachycardia likely secondary to pain.  This chart was dictated using voice recognition software.  Despite best efforts to proofread,  errors can occur which can change the documentation meaning.    Lowery Rue, DO 03/11/24 2007

## 2024-03-24 ENCOUNTER — Telehealth: Payer: Self-pay | Admitting: Cardiovascular Disease

## 2024-03-24 NOTE — Telephone Encounter (Signed)
 Pt c/o swelling: STAT is pt has developed SOB within 24 hours  How much weight have you gained and in what time span? Unsure  If swelling, where is the swelling located? Feet, ankles, legs and thighs  Are you currently taking a fluid pill? No  Are you currently SOB? No  Do you have a log of your daily weights (if so, list)? No  Have you gained 3 pounds in a day or 5 pounds in a week? Yes  Have you traveled recently? No

## 2024-03-24 NOTE — Progress Notes (Signed)
 CARDIOLOGY CONSULT NOTE       Patient ID: Sarah Spencer MRN: [id number] DOB/AGE: 69-Sep-1957 69 y.o.  Admit date: (Not on file) Referring Physician: Woodrow Hazy MD  Primary Physician: Ulysees Gander, MD Primary Cardiologist: Stann Earnest   HPI:  68 y.o.  first seen by me 01/07/23. Post menopausal bleeding needing OB/GYN surgery with anesthesia. She has had issues with sinusitis and vaginal spotting since February 2024  Rx with antibiotics and steroids   History of obesity, GERD, Depression/Anxiety and HLD Father had premature CAD and sister with CVA and renal failure   I see no history of vascular dx, CAD, Valve dx CHF or bleeding issues She has had anesthesia in past for laminectomy 09/21/21 with no issues  Seen by Dr Eston Hence for dyspnea 07/19/21 TTE was normal EF > 55% mild TR She indicates her dyspnea was from prolonged bronchitis and resolved when this was better so she never f/u with cardiology  She is widowed Two daughters one lives with her and one in [place]  Retired from [employer] 6 mets no symptoms and no issues with prior back/neck surgery   Uncomplicated hysterectomy with Dr Steve El done 03/03/23  Had left shoulder replacement 03/11/24 Went to ER same day with tachycardia She had dyspnea when she awoke from anesthesia  No chest pain Given iv hydration, fentanyl  and Potassium  Called office 03/24/24 complaining of elevated HR and LE edema CXR with elevated left hemidiaphragm, bandlike opacity in left lung base ? Atelectasis  LABS  Hct 37.2 WBC 14.4 K 3.3 BS 163 CR 0.76 ECG ST rate 116 ICRBBB  No d dimer or BNB, Thyroid  done  Was d/c home from ER  Today her HR is improved in 80's  She has bilateral varicosities with some edema but seems dependant from being overweight.   ROS All other systems reviewed and negative except as noted above  Past Medical History:  Diagnosis Date   Allergies    Anxiety    Follows w/ PCP @ [address], Dr. Mason Sole.    Arthritis    knees   Bronchitis    recurrent   COVID-19 11/06/2020   Depression    Follows w/ PCP @ [facility], Dr. Mason Sole   Family history of adverse reaction to anesthesia    sister-extreme N&V   GERD (gastroesophageal reflux disease)    Hyperlipidemia    Follows w/ PCP.   Obesity    Osteoporosis    Pedal edema    mild   Pneumonia    as a child and a few times as an adult, hasn't had in years as of 02/11/23   PONV (postoperative nausea and vomiting)    SOBOE (shortness of breath on exertion) 2022   Only when patient had bronchitis. Seen by Dr. Paraschos@ [facility]. 08/08/21 TEE was normal EF>55%, mild TR.   Vitamin D deficiency     Family History  Problem Relation Age of Onset   Heart disease Father    Heart disease Sister    Other Brother        step brother   Heart disease Sister    Stroke Sister        caused by medicine   Colon cancer Neg Hx    Esophageal cancer Neg Hx    Rectal cancer Neg Hx     Social History   Socioeconomic History   Marital status: Widowed    Spouse name: Not on file   Number  of children: 4   Years of education: Not on file   Highest education level: Not on file  Occupational History   Occupation: retired  Tobacco Use   Smoking status: Never   Smokeless tobacco: Never  Vaping Use   Vaping status: Never Used  Substance and Sexual Activity   Alcohol use: No   Drug use: No   Sexual activity: Not Currently    Birth control/protection: Post-menopausal  Other Topics Concern   Not on file  Social History Narrative   Not on file   Social Drivers of Health   Financial Resource Strain: Not on file  Food Insecurity: Not on file  Transportation Needs: Not on file  Physical Activity: Not on file  Stress: Not on file  Social Connections: Not on file  Intimate Partner Violence: Not on file    Past Surgical History:  Procedure Laterality Date   CERVICAL DISC SURGERY     Dr. Rochelle Chu , neurosurgeon.   CESAREAN SECTION  1994   x 1    COLONOSCOPY  2023   ESOPHAGOGASTRODUODENOSCOPY     many years ago as of 02/11/23   LAMINECTOMY WITH POSTERIOR LATERAL ARTHRODESIS LEVEL 3 Bilateral 09/21/2021   Procedure: Laminectomy and Foraminotomy Lumbar Two - Lumbar Three, Lumbar Three - Lumbar Four, Lumbar Four - Lumbar Five - Bilateral, Posterolateral Fusion Lumbar Two - Lumbar Three, Lumbar Three - Lumbar Four, Lumbar Four - Lumbar Five;  Surgeon: Isadora Mar, MD;  Location: [facility] OR;  Service: Neurosurgery;  Laterality: Bilateral;   laparascopy     gyn, 1980's   LAPAROSCOPIC VAGINAL HYSTERECTOMY WITH SALPINGO OOPHORECTOMY Bilateral 03/03/2023   Procedure: LAPAROSCOPIC ASSISTED VAGINAL HYSTERECTOMY WITH SALPINGO OOPHORECTOMY;  Surgeon: Woodrow Hazy, MD;  Location: [facility];  Service: Gynecology;  Laterality: Bilateral;   TONSILLECTOMY  1964   TOTAL SHOULDER REPLACEMENT Left       Current Outpatient Medications:    albuterol  (VENTOLIN  HFA) 108 (90 Base) MCG/ACT inhaler, Inhale 2 puffs into the lungs every 6 (six) hours as needed., Disp: 18 g, Rfl: 0   atorvastatin  (LIPITOR) 40 MG tablet, Take 1 tablet (40 mg total) by mouth daily., Disp: 90 tablet, Rfl: 2   Cholecalciferol (VITAMIN D3) 50 MCG (2000 UT) TABS, Take 2,000 Units by mouth daily., Disp: , Rfl:    citalopram  (CELEXA ) 20 MG tablet, Take 20 mg by mouth daily., Disp: , Rfl:    clonazePAM  (KLONOPIN ) 0.5 MG tablet, Take 0.25-0.5 mg by mouth 3 (three) times daily as needed for anxiety., Disp: , Rfl:    Coenzyme Q10 (COQ10) 100 MG CAPS, Take 100 mg by mouth daily., Disp: , Rfl:    diclofenac  (VOLTAREN ) 75 MG EC tablet, Take 75 mg by mouth daily., Disp: , Rfl:    famotidine  (PEPCID ) 20 MG tablet, Take 20 mg by mouth 2 (two) times daily., Disp: , Rfl:    montelukast  (SINGULAIR ) 10 MG tablet, Take 10 mg by mouth at bedtime., Disp: , Rfl:    Multiple Vitamin (MULTIVITAMIN) tablet, Take 1 tablet by mouth daily., Disp: , Rfl:    niacin 500 MG tablet, Take 500 mg  by mouth at bedtime., Disp: , Rfl:    Propylene Glycol (SYSTANE COMPLETE) 0.6 % SOLN, Place 1 drop into both eyes 2 (two) times daily as needed (dry eyes)., Disp: , Rfl:    vitamin C (ASCORBIC ACID) 250 MG tablet, Take 250 mg by mouth daily., Disp: , Rfl:     Physical Exam: There were no  vitals taken for this visit.   Affect appropriate Obese white female  HEENT: normal Neck supple with no adenopathy JVP normal no bruits no thyromegaly Lungs clear with no wheezing and good diaphragmatic motion Heart:  S1/S2 no murmur, no rub, gallop or click PMI normal Abdomen: benighn, BS positve, no tenderness, no AAA no bruit.  No HSM or HJR prior hysterectomy Distal pulses intact with no bruits Plus one bilateral edema Neuro non-focal Skin warm and dry Post right shoulder replacement    Labs:   Lab Results  Component Value Date   WBC 14.4 (H) 03/11/2024   HGB 12.2 03/11/2024   HCT 37.2 03/11/2024   MCV 89.9 03/11/2024   PLT 299 03/11/2024   No results for input(s): NA, K, CL, CO2, BUN, CREATININE, CALCIUM , PROT, BILITOT, ALKPHOS, ALT, AST, GLUCOSE in the last 168 hours.  Invalid input(s): LABALBU No results found for: CKTOTAL, CKMB, CKMBINDEX, TROPONINI  Lab Results  Component Value Date   CHOL 179 01/22/2024   Lab Results  Component Value Date   HDL 47 01/22/2024   Lab Results  Component Value Date   LDLCALC 109 (H) 01/22/2024   Lab Results  Component Value Date   TRIG 126 01/22/2024   Lab Results  Component Value Date   CHOLHDL 3.8 01/22/2024   No results found for: LDLDIRECT    Radiology: DG Chest Portable 1 View Result Date: 03/11/2024 CLINICAL DATA:  Tachycardia EXAM: PORTABLE CHEST 1 VIEW COMPARISON:  X-ray 02/15/2021.  CTA 06/14/2021. FINDINGS: Elevation left hemidiaphragm is new. There is some bandlike opacities at the left lung base. Favor atelectasis. No pneumothorax or effusion. No consolidation. Normal  cardiopericardial silhouette. Prominent central vasculature. Calcified aorta. Overlapping cardiac leads. Degenerative changes along the spine. Left shoulder reverse arthroplasty identified. Stomach is distended with air beneath the left hemidiaphragm. IMPRESSION: Elevated left hemidiaphragm with some bandlike opacity at the left lung base. Atelectasis is favored. Prominent central vasculature. Surgical changes of left shoulder reverse arthroplasty. Electronically Signed   By: Adrianna Horde M.D.   On: 03/11/2024 20:10    EKG: SR rate 76 poor R Wave progression  03/25/2024 SR rate 81 normal    ASSESSMENT AND PLAN:   Tachycardia:  check TSH/T4 Hct ok related to recent anesthesia and surgery ECG sinus tachycardia no PAF/flutter. Will get 30 day monitor Today she is in NSR rates 80's Suspect baseline HR will be on higher side due to deconditioning and obesity Dyspnea:  Needs D dimer and possible CTA r/o PE. Check echo for effusion/RV/LV function. CXR with some atelectasis check BNP Edema:  post surgery check LE venous duplex mild bilateral doubt DVT Start lasix 20 mg daily  HLD:  continue statin given family history   Anxiety:  PRN klonopin  f/u primary  Depression: Widowed continue Celexa   GERD:  low carb diet Pepcid   BNP/TSH/T4 D dimer Echo for dyspnea 30 day monitor r/o PAF with tachycardia LE venous duplex r/o DVT post surgery   F/U 2 weeks pending labs/studies    Signed: Janelle Mediate 03/25/2024, 9:11 AM

## 2024-03-24 NOTE — Telephone Encounter (Signed)
 Called patient back about message. Patient stated she has been swelling from her feet to her thighs that keeps going up and down, but not totally improving. Patient stated her HR is doing the same. Made patient an appointment to see Dr. Stann Earnest tomorrow morning, and will get EKG to see if she is having any irregular rhythms. Encouraged patient to go to ED if she has any SOB.

## 2024-03-25 ENCOUNTER — Encounter: Payer: Self-pay | Admitting: *Deleted

## 2024-03-25 ENCOUNTER — Ambulatory Visit: Attending: Cardiovascular Disease | Admitting: Cardiovascular Disease

## 2024-03-25 ENCOUNTER — Encounter: Payer: Self-pay | Admitting: Cardiovascular Disease

## 2024-03-25 ENCOUNTER — Other Ambulatory Visit: Payer: Self-pay

## 2024-03-25 VITALS — BP 130/72 | HR 81 | Ht 65.0 in | Wt 265.8 lb

## 2024-03-25 DIAGNOSIS — R0602 Shortness of breath: Secondary | ICD-10-CM

## 2024-03-25 DIAGNOSIS — R600 Localized edema: Secondary | ICD-10-CM | POA: Diagnosis not present

## 2024-03-25 DIAGNOSIS — I479 Paroxysmal tachycardia, unspecified: Secondary | ICD-10-CM

## 2024-03-25 MED ORDER — FUROSEMIDE 20 MG PO TABS: 20.0000 mg | ORAL_TABLET | Freq: Every day | ORAL | 3 refills | Status: DC

## 2024-03-25 NOTE — Patient Instructions (Signed)
 Medication Instructions:  Your physician has recommended you make the following change in your medication:  1- Take lasix 20 mg by mouth daily.  *If you need a refill on your cardiac medications before your next appointment, please call your pharmacy*  Lab Work: Your physician recommends that you have lab work today. BNP, TSH, T4, D-dimer If you have labs (blood work) drawn today and your tests are completely normal, you will receive your results only by: MyChart Message (if you have MyChart) OR A paper copy in the mail If you have any lab test that is abnormal or we need to change your treatment, we will call you to review the results.  Testing/Procedures: Your physician has requested that you have an echocardiogram. Echocardiography is a painless test that uses sound waves to create images of your heart. It provides your doctor with information about the size and shape of your heart and how well your heart's chambers and valves are working. This procedure takes approximately one hour. There are no restrictions for this procedure. Please do NOT wear cologne, perfume, aftershave, or lotions (deodorant is allowed). Please arrive 15 minutes prior to your appointment time.  Your physician has recommended that you wear an event monitor. Event monitors are medical devices that record the heart's electrical activity. Doctors most often us  these monitors to diagnose arrhythmias. Arrhythmias are problems with the speed or rhythm of the heartbeat. The monitor is a small, portable device. You can wear one while you do your normal daily activities. This is usually used to diagnose what is causing palpitations/syncope (passing out).  Your physician has requested that you have a lower extremity venous duplex. This test is an ultrasound of the veins in the legs. It looks at venous blood flow that carries blood from the heart to the legs. Allow one hour for a Lower Venous exam. There are no restrictions or  special instructions.  Please note: We ask at that you not bring children with you during ultrasound (echo/ vascular) testing. Due to room size and safety concerns, children are not allowed in the ultrasound rooms during exams. Our front office staff cannot provide observation of children in our lobby area while testing is being conducted. An adult accompanying a patient to their appointment will only be allowed in the ultrasound room at the discretion of the ultrasound technician under special circumstances. We apologize for any inconvenience.  Follow-Up: At [facility], you and your health needs are our priority.  As part of our continuing mission to provide you with exceptional heart care, our providers are all part of one team.  This team includes your primary Cardiologist (physician) and Advanced Practice Providers or APPs (Physician Assistants and Nurse Practitioners) who all work together to provide you with the care you need, when you need it.  Your next appointment:   2 week(s)  Provider:   Janelle Mediate, MD    We recommend signing up for the patient portal called MyChart.  Sign up information is provided on this After Visit Summary.  MyChart is used to connect with patients for Virtual Visits (Telemedicine).  Patients are able to view lab/test results, encounter notes, upcoming appointments, etc.  Non-urgent messages can be sent to your provider as well.   To learn more about what you can do with MyChart, go to [email or web address].   Other Instructions

## 2024-03-25 NOTE — Progress Notes (Unsigned)
 Patient enrolled for [employer] to ship a 30 day cardiac event monitor to her address on file.  Letter with instructions mailed to patient.

## 2024-03-26 ENCOUNTER — Ambulatory Visit: Payer: Self-pay | Admitting: Cardiovascular Disease

## 2024-03-26 ENCOUNTER — Ambulatory Visit (HOSPITAL_COMMUNITY)
Admission: RE | Admit: 2024-03-26 | Discharge: 2024-03-26 | Disposition: A | Discharge status: Home / Self Care | Source: Ambulatory Visit | Attending: Cardiovascular Disease | Admitting: Cardiovascular Disease

## 2024-03-26 DIAGNOSIS — R0609 Other forms of dyspnea: Secondary | ICD-10-CM | POA: Insufficient documentation

## 2024-03-26 DIAGNOSIS — R600 Localized edema: Secondary | ICD-10-CM | POA: Diagnosis not present

## 2024-03-26 DIAGNOSIS — I700 Atherosclerosis of aorta: Secondary | ICD-10-CM | POA: Insufficient documentation

## 2024-03-26 DIAGNOSIS — I2510 Atherosclerotic heart disease of native coronary artery without angina pectoris: Secondary | ICD-10-CM | POA: Diagnosis not present

## 2024-03-26 LAB — PRO B NATRIURETIC PEPTIDE: NT-Pro BNP: 71 pg/mL (ref 0–301)

## 2024-03-26 LAB — TSH: TSH: 4.09 u[IU]/mL (ref 0.450–4.500)

## 2024-03-26 LAB — T4, FREE: Free T4: 1.29 ng/dL (ref 0.82–1.77)

## 2024-03-26 LAB — D-DIMER, QUANTITATIVE: D-DIMER: 7.99 mg{FEU}/L — ABNORMAL HIGH (ref 0.00–0.49)

## 2024-03-26 MED ORDER — IOHEXOL 350 MG/ML SOLN
100.0000 mL | Freq: Once | INTRAVENOUS | Status: AC | PRN
Start: 2024-03-26 — End: 2024-03-26
  Administered 2024-03-26: 120 mL via INTRAVENOUS

## 2024-03-26 NOTE — Telephone Encounter (Signed)
-----   Message from Janelle Mediate sent at 03/26/2024 11:37 AM EDT ----- D dimer very elevated needs CTA r/o PE today ----- Message ----- From: Interface, [facility] Lab Results In Sent: 03/26/2024   3:35 AM EDT To: Loyde Rule, MD

## 2024-03-26 NOTE — Telephone Encounter (Signed)
Left message for patient to call back as soon as possible

## 2024-04-06 ENCOUNTER — Ambulatory Visit (HOSPITAL_COMMUNITY)
Admission: RE | Admit: 2024-04-06 | Discharge: 2024-04-06 | Disposition: A | Discharge status: Home / Self Care | Source: Ambulatory Visit | Attending: Cardiovascular Disease | Admitting: Cardiovascular Disease

## 2024-04-06 ENCOUNTER — Telehealth: Payer: Self-pay | Admitting: Cardiovascular Disease

## 2024-04-06 DIAGNOSIS — R0602 Shortness of breath: Secondary | ICD-10-CM | POA: Diagnosis present

## 2024-04-06 DIAGNOSIS — R600 Localized edema: Secondary | ICD-10-CM

## 2024-04-06 NOTE — Progress Notes (Signed)
 Cardiology Office Note:  .   Date:  04/19/2024  ID:  Sarah Spencer, DOB 1956-09-20, MRN [id number] PCP: Maree Leni Edyth DELENA, MD  [facility] Providers Cardiologist:  Maude Emmer, MD    History of Present Illness: .   Sarah Spencer is a 68 y.o. female  with history of obesity, GERD, Depression/Anxiety and HLD Father had premature CAD and sister with CVA and renal failure. Was seen by Dr Ammon Glenn for dyspnea 07/19/21 TTE was normal EF > 55% mild TR She indicates her dyspnea was from prolonged bronchitis and resolved when this was better so she never f/u with cardiology  Uncomplicated hysterectomy with Dr Johnnye done 03/03/23  Had left shoulder replacement 03/11/24 Went to ER same day with tachycardia She had dyspnea when she awoke from anesthesia  No chest pain Given iv hydration, fentanyl  and Potassium   Called office 03/24/24 complaining of elevated HR and LE edema CXR with elevated left hemidiaphragm, bandlike opacity in left lung base. Dr. Emmer ordered echo, 30 day monitor and D dimer that was elevated. CT angio-no PE, LE dopplers no DVT. Echo normal LVEF. She also was seen in Afib clinic and started on eliquis  for one episode of Afib 04/09/24. Plan was to stop eliquis  after monitor results were back. Also started on metoprolol  and no further palpitation.      ROS:    Studies Reviewed: SABRA         Prior CV Studies:    Echo 04/14/24 IMPRESSIONS     1. Left ventricular ejection fraction, by estimation, is 60 to 65%. The  left ventricle has normal function. The left ventricle has no regional  wall motion abnormalities. Left ventricular diastolic parameters were  normal. The average left ventricular  global longitudinal strain is -14.4 %. The global longitudinal strain is  abnormal.   2. Right ventricular systolic function is normal. The right ventricular  size is normal.   3. The mitral valve is grossly normal. No evidence of mitral valve  regurgitation. No  evidence of mitral stenosis.   4. The aortic valve is normal in structure. Aortic valve regurgitation is  not visualized. No aortic stenosis is present.   5. The inferior vena cava is normal in size with greater than 50%  respiratory variability, suggesting right atrial pressure of 3 mmHg.   CT angio chest 03/26/24 IMPRESSION: 1. No evidence of pulmonary embolism to the level of the segmental pulmonary arteries or other acute intrathoracic process. 2. Aortic Atherosclerosis (ICD10-I70.0). Coronary artery calcifications. Assessment for potential risk factor modification, dietary therapy or pharmacologic therapy may be warranted, if clinically indicated.   These results were called by telephone at the time of interpretation on 03/26/2024 at 4:29 pm to provider MAUDE EMMER , who verbally acknowledged these results.     Electronically Signed   By: Limin  Xu M.D.   On: 03/26/2024 16:43  Risk Assessment/Calculations:    CHA2DS2-VASc Score = 2   This indicates a 2.2% annual risk of stroke. The patient's score is based upon: CHF History: 0 HTN History: 0 Diabetes History: 0 Stroke History: 0 Vascular Disease History: 0 Age Score: 1 Gender Score: 1             Physical Exam:   VS:  BP 120/72   Pulse 89   Ht 5' 5 (1.651 m)   Wt 257 lb (116.6 kg)   SpO2 95%   BMI 42.77 kg/m    Orhtostatics: No data found.  Wt Readings from Last 3 Encounters:  04/19/24 257 lb (116.6 kg)  04/12/24 261 lb (118.4 kg)  03/25/24 265 lb 12.8 oz (120.6 kg)    GEN: Obese, in no acute distress NECK: No JVD; No carotid bruits CARDIAC:  RRR, no murmurs, rubs, gallops RESPIRATORY:  Clear to auscultation without rales, wheezing or rhonchi  ABDOMEN: Soft, non-tender, non-distended EXTREMITIES:  No edema; No deformity   ASSESSMENT AND PLAN: .     PAF/Tachycardia:   30 day monitor showed an episode of Afib 04/09/24 and started on eliquis  by Afib clinic but she stopped it already. Plan was to  probably stop after monitor back but she's still wearing monitor. Symptoms improved on low dose metoprolol . Will wait for monitor results to come back and leave it up to Afib clinic to restart eliquis  or not. She needs to schedule sleep study.    Dyspnea:   improved. CTA negative  PE. Echo showed normal /RV/LV function. CXR with some atelectasis, BNP 71  Edema:  post surgery check LE venous duplex mild bilateral doubt DVT On lasix  20 mg daily. No edema today. Echo normal LVEF. Change lasix  to prn  HLD:  continue statin given family history               Dispo: f/u as scheduled with Afib clinic.  Signed, Olivia Pavy, PA-C

## 2024-04-06 NOTE — Telephone Encounter (Signed)
Calling with a call report. Please advise  

## 2024-04-06 NOTE — Telephone Encounter (Signed)
 Call received from Devere, at  [facility]. Called with normal results to r/o DVT.  Made nurse aware that this will be forward to provider,

## 2024-04-09 ENCOUNTER — Telehealth: Payer: Self-pay | Admitting: Cardiovascular Disease

## 2024-04-09 ENCOUNTER — Telehealth: Payer: Self-pay

## 2024-04-09 NOTE — Telephone Encounter (Signed)
 Called [place] (spoke to [place]) regarding incoming Triage phone call. The alert was for new onset Afib that has already been handled earlier today by [place] DASEN, RN (Triage Nurse). The alert occurred on 04/08/2024 at 5:47 pm.

## 2024-04-09 NOTE — Telephone Encounter (Signed)
   Cardiac Monitor Alert  Date of alert:  04/09/2024   Patient Name: Sarah Spencer  DOB: Jul 12, 1956  MRN: [id number]   [facility] Cardiologist: Maude Emmer, MD  [facility] EP:  None    Monitor Information: Cardiac Event Monitor [[employer]]  Reason: LE EDEMA/SHORTNESS OF BREATH  Ordering provider:  MAUDE EMMER, MD  { Alert Sinus Tachycardia with New Onset Atrial Fibrillation This is the 1st alert for this rhythm.  The patient has no hx of Atrial Fibrillation/Flutter.  The patient is not currently on anticoagulation.  Next Cardiology Appointment   Date:  04/19/24  Provider:  Olivia Pavy, PA-C  The patient was contacted today.  She is symptomatic.  She reports the following symptoms:  mild palpitations . Arrhythmia, symptoms and history reviewed with Ole Holts, MD .  Plan:  Referral to AFIB CLINIC.  The patient was referred to the Atrial Fibrillation Clinic.  Appt date/time:  04/12/2024 @ 3:30pm.  Patient provided DOD recommendations and verbalized understanding of upcoming appt date/time. No further questions at this time.  Other:   [name], RN  04/09/2024 10:54 AM

## 2024-04-09 NOTE — Telephone Encounter (Signed)
 [place] calling with urgent notification

## 2024-04-12 ENCOUNTER — Ambulatory Visit (HOSPITAL_COMMUNITY)
Admission: RE | Admit: 2024-04-12 | Discharge: 2024-04-12 | Disposition: A | Discharge status: Home / Self Care | Source: Ambulatory Visit | Attending: Internal Medicine | Admitting: Internal Medicine

## 2024-04-12 ENCOUNTER — Other Ambulatory Visit (HOSPITAL_COMMUNITY): Payer: Self-pay

## 2024-04-12 ENCOUNTER — Telehealth: Payer: Self-pay

## 2024-04-12 VITALS — BP 122/76 | HR 95 | Ht 65.0 in | Wt 261.0 lb

## 2024-04-12 DIAGNOSIS — I4891 Unspecified atrial fibrillation: Secondary | ICD-10-CM

## 2024-04-12 DIAGNOSIS — I480 Paroxysmal atrial fibrillation: Secondary | ICD-10-CM

## 2024-04-12 MED ORDER — APIXABAN 5 MG PO TABS: 5.0000 mg | ORAL_TABLET | Freq: Two times a day (BID) | ORAL | 0 refills | Status: DC

## 2024-04-12 MED ORDER — APIXABAN 5 MG PO TABS
5.0000 mg | ORAL_TABLET | Freq: Two times a day (BID) | ORAL | 11 refills | Status: DC
  Filled 2024-04-12: qty 60, 30d supply, fill #0

## 2024-04-12 MED ORDER — METOPROLOL SUCCINATE ER 25 MG PO TB24: 25.0000 mg | ORAL_TABLET | Freq: Every day | ORAL | 11 refills | Status: AC

## 2024-04-12 NOTE — Telephone Encounter (Signed)
 Medication name/dosage: Samples List: Eliquis 5 mg  Administration instructions: Take one tablet by mouth twice daily  Reason for samples: Reason for samples: new start  Ordering provider: Dr. Delford  *Once above information entered, route the phone encounter to CV DIV MAG ST SAMPLES and send Teams message to team member assigned to Samples for the day.

## 2024-04-12 NOTE — Patient Instructions (Signed)
 Start Eliquis 5 mg twice a day  Start Toprol 12.5 mg ( half of tablet) every evening  Kardia mobile 1 -lead   Sleep study -- scheduling will call once insurance authorization received.

## 2024-04-12 NOTE — Telephone Encounter (Signed)
 Delford Maude BROCKS, MD to [facility] Triage  Drena Noel Males, RN  Parthenia Olivia HERO, PA-C    04/09/24  7:14 PM Would be nice to see actual strip from report couldn't see it in CV strips or media. Labs recently ok would start on eliquis while wating to see afib clinic or PA Rosaline Parthenia  Called patient with Dr. Claiborne recommendations. Will send to pharmacy on first floor. Will have samples for her upstairs to pick up after her appointment with A. FIB clinic.

## 2024-04-12 NOTE — Telephone Encounter (Signed)
 Eliquis 5 mg by mouth twice daily for 28 days Samples is ready to pick at the front desk, 5th floor, Coumadin Clinic.

## 2024-04-12 NOTE — Addendum Note (Signed)
 Addended by: DRENA MARTINIS, Shalandria Elsbernd L on: 04/12/2024 08:34 AM   Modules accepted: Orders

## 2024-04-12 NOTE — Progress Notes (Addendum)
 Primary Care Physician: Maree Leni Edyth DELENA, MD Primary Cardiologist: Maude Emmer, MD Electrophysiologist: None     Referring Physician: Dr. Cindie Hoose DALAINA Sarah Spencer is a 68 y.o. female with a history of GERD, obesity, HLD, and atrial fibrillation who presents for consultation in the [facility]. Seen by Dr. Emmer on 6/12 for elevated HR and LE edema. 30-day event monitor placed and LE U/S ordered. U/S was negative. Cardiac monitor alert on 6/27 for new Afib; patient placed on Eliquis. Rhythm strip showing Afib noted for event on 04/08/24 at 1747. Patient was prescribed Eliquis 5 mg BID for a CHADS2VASC score of 2.  On evaluation today, she is currently in NSR. Patient notes she can feel intermittent palpitations. She has not yet started Eliquis. She has been told by her daughter she snores loudly.  Today, she denies symptoms of chest pain, shortness of breath, orthopnea, PND, lower extremity edema, dizziness, presyncope, syncope, snoring, daytime somnolence, bleeding, or neurologic sequela. The patient is tolerating medications without difficulties and is otherwise without complaint today.    Atrial Fibrillation Risk Factors:  she does have symptoms or diagnosis of sleep apnea.  she has a BMI of Body mass index is 43.43 kg/m.SABRA Filed Weights   04/12/24 1526  Weight: 118.4 kg    Current Outpatient Medications  Medication Sig Dispense Refill   albuterol  (VENTOLIN  HFA) 108 (90 Base) MCG/ACT inhaler Inhale 2 puffs into the lungs every 6 (six) hours as needed. (Patient taking differently: Inhale 2 puffs into the lungs as needed.) 18 g 0   atorvastatin  (LIPITOR) 40 MG tablet Take 1 tablet (40 mg total) by mouth daily. 90 tablet 2   Cholecalciferol (VITAMIN D3) 50 MCG (2000 UT) TABS Take 2,000 Units by mouth daily.     citalopram  (CELEXA ) 20 MG tablet Take 20 mg by mouth daily.     clonazePAM  (KLONOPIN ) 0.5 MG tablet Take 0.25-0.5 mg by mouth 3 (three)  times daily as needed for anxiety. (Patient taking differently: Take 0.25-0.5 mg by mouth as needed for anxiety.)     Coenzyme Q10 (COQ10) 100 MG CAPS Take 100 mg by mouth daily.     diclofenac  (VOLTAREN ) 75 MG EC tablet Take 75 mg by mouth daily.     ELDERBERRY PO Take 1 tablet by mouth every morning.     famotidine  (PEPCID ) 20 MG tablet Take 20 mg by mouth 2 (two) times daily. (Patient taking differently: Take 20 mg by mouth every morning.)     furosemide  (LASIX ) 20 MG tablet Take 1 tablet (20 mg total) by mouth daily. 90 tablet 3   montelukast  (SINGULAIR ) 10 MG tablet Take 10 mg by mouth at bedtime.     Multiple Vitamin (MULTIVITAMIN) tablet Take 1 tablet by mouth daily.     niacin 500 MG tablet Take 500 mg by mouth at bedtime.     Propylene Glycol (SYSTANE COMPLETE) 0.6 % SOLN Place 1 drop into both eyes 2 (two) times daily as needed (dry eyes).     vitamin C (ASCORBIC ACID) 250 MG tablet Take 250 mg by mouth daily.     apixaban (ELIQUIS) 5 MG TABS tablet Take 1 tablet (5 mg total) by mouth 2 (two) times daily. (Patient not taking: Reported on 04/12/2024) 60 tablet 11   apixaban (ELIQUIS) 5 MG TABS tablet Take 1 tablet (5 mg total) by mouth 2 (two) times daily. (Patient not taking: Reported on 04/12/2024) 56 tablet 0  No current facility-administered medications for this encounter.    Atrial Fibrillation Management history:  Previous antiarrhythmic drugs: none Previous cardioversions: none Previous ablations: none Anticoagulation history: Eliquis   ROS- All systems are reviewed and negative except as per the HPI above.  Physical Exam: Ht 5' 5 (1.651 m)   Wt 118.4 kg   BMI 43.43 kg/m   GEN: Well nourished, well developed in no acute distress NECK: No JVD; No carotid bruits CARDIAC: Regular rate and rhythm, no murmurs, rubs, gallops RESPIRATORY:  Clear to auscultation without rales, wheezing or rhonchi  ABDOMEN: Soft, non-tender, non-distended EXTREMITIES:  No edema; No  deformity   EKG today demonstrates  Vent. rate 95 BPM PR interval 162 ms QRS duration 80 ms QT/QTcB 360/452 ms P-R-T axes 74 -30 55 Sinus rhythm with Premature atrial complexes Left axis deviation Possible Inferior infarct (cited on or before 25-Mar-2024) Abnormal ECG When compared with ECG of 25-Mar-2024 09:10, Premature atrial complexes are now Present  Echo ([facility]) 07/19/21 demonstrated  INTERPRETATION  NORMAL LEFT VENTRICULAR SYSTOLIC FUNCTION  NORMAL RIGHT VENTRICULAR SYSTOLIC FUNCTION  MILD VALVULAR REGURGITATION (See above)  NO VALVULAR STENOSIS   ASSESSMENT & PLAN CHA2DS2-VASc Score = 2  The patient's score is based upon: CHF History: 0 HTN History: 0 Diabetes History: 0 Stroke History: 0 Vascular Disease History: 0 Age Score: 1 Gender Score: 1       ASSESSMENT AND PLAN: Paroxysmal Atrial Fibrillation (ICD10:  I48.0) The patient's CHA2DS2-VASc score is 2, indicating a 2.2% annual risk of stroke.    She is currently in NSR. Education provided about Afib. Discussion about possible triggers for Afib. Due to presence of PACs that patient can feel and notes that it bothers her, will begin Toprol 12.5 mg at bedtime. Rhythm monitoring device recommended. She will begin Eliquis 5 mg BID and likely discontinue it in the future once monitor results are back. This plan is reasonable because patient has a low risk score and wishes to not be on OAC indefinitely. Will schedule patient to be referred for sleep study. She is scheduled for repeat echocardiogram on 7/2.     Follow up 6 weeks to discuss monitor results.    Terra Pac, PA-C  Afib Clinic [facility] [address] [place], [place] [address] [phone]

## 2024-04-12 NOTE — Addendum Note (Signed)
 Addended by: TAFFY LOVEY KIDD on: 04/12/2024 03:15 PM   Modules accepted: Orders

## 2024-04-14 ENCOUNTER — Ambulatory Visit (HOSPITAL_COMMUNITY)
Admission: RE | Admit: 2024-04-14 | Discharge: 2024-04-14 | Disposition: A | Discharge status: Home / Self Care | Source: Ambulatory Visit | Attending: Cardiovascular Disease | Admitting: Cardiovascular Disease

## 2024-04-14 DIAGNOSIS — R600 Localized edema: Secondary | ICD-10-CM | POA: Diagnosis not present

## 2024-04-14 DIAGNOSIS — R609 Edema, unspecified: Secondary | ICD-10-CM | POA: Diagnosis present

## 2024-04-14 DIAGNOSIS — R0602 Shortness of breath: Secondary | ICD-10-CM | POA: Diagnosis not present

## 2024-04-14 LAB — ECHOCARDIOGRAM COMPLETE
Area-P 1/2: 5.93 cm2
Calc EF: 59 %
S' Lateral: 3.4 cm
Single Plane A2C EF: 67 %
Single Plane A4C EF: 56.3 %

## 2024-04-19 ENCOUNTER — Encounter: Payer: Self-pay | Admitting: Physician Assistant

## 2024-04-19 ENCOUNTER — Ambulatory Visit: Attending: Physician Assistant | Admitting: Physician Assistant

## 2024-04-19 ENCOUNTER — Encounter: Payer: Self-pay | Admitting: Cardiovascular Disease

## 2024-04-19 VITALS — BP 120/72 | HR 89 | Ht 65.0 in | Wt 257.0 lb

## 2024-04-19 DIAGNOSIS — I480 Paroxysmal atrial fibrillation: Secondary | ICD-10-CM

## 2024-04-19 DIAGNOSIS — E785 Hyperlipidemia, unspecified: Secondary | ICD-10-CM | POA: Diagnosis not present

## 2024-04-19 DIAGNOSIS — R0602 Shortness of breath: Secondary | ICD-10-CM | POA: Diagnosis not present

## 2024-04-19 DIAGNOSIS — R600 Localized edema: Secondary | ICD-10-CM | POA: Diagnosis not present

## 2024-04-19 MED ORDER — FUROSEMIDE 20 MG PO TABS: 20.0000 mg | ORAL_TABLET | ORAL | 3 refills | Status: AC | PRN

## 2024-04-19 NOTE — Patient Instructions (Addendum)
 Medication Instructions:  Your physician has recommended you make the following change in your medication:  DECREASE LASIX  TO AS NEEDED   *If you need a refill on your cardiac medications before your next appointment, please call your pharmacy*  Lab Work: TODAY: BMET If you have labs (blood work) drawn today and your tests are completely normal, you will receive your results only by: MyChart Message (if you have MyChart) OR A paper copy in the mail If you have any lab test that is abnormal or we need to change your treatment, we will call you to review the results.  Testing/Procedures: NONE  Follow-Up: At [facility], you and your health needs are our priority.  As part of our continuing mission to provide you with exceptional heart care, our providers are all part of one team.  This team includes your primary Cardiologist (physician) and Advanced Practice Providers or APPs (Physician Assistants and Nurse Practitioners) who all work together to provide you with the care you need, when you need it.  Your next appointment:   KEEP SCHEDULED FOLLOW-UP  We recommend signing up for the patient portal called MyChart.  Sign up information is provided on this After Visit Summary.  MyChart is used to connect with patients for Virtual Visits (Telemedicine).  Patients are able to view lab/test results, encounter notes, upcoming appointments, etc.  Non-urgent messages can be sent to your provider as well.   To learn more about what you can do with MyChart, go to [email or web address].

## 2024-04-20 ENCOUNTER — Ambulatory Visit: Payer: Self-pay | Admitting: Physician Assistant

## 2024-04-20 LAB — BASIC METABOLIC PANEL WITH GFR
BUN/Creatinine Ratio: 18 (ref 12–28)
BUN: 14 mg/dL (ref 8–27)
CO2: 25 mmol/L (ref 20–29)
Calcium: 10.1 mg/dL (ref 8.7–10.3)
Chloride: 99 mmol/L (ref 96–106)
Creatinine, Ser: 0.8 mg/dL (ref 0.57–1.00)
Glucose: 97 mg/dL (ref 70–99)
Potassium: 5.2 mmol/L (ref 3.5–5.2)
Sodium: 140 mmol/L (ref 134–144)
eGFR: 80 mL/min/1.73 (ref >59)

## 2024-05-03 ENCOUNTER — Ambulatory Visit: Attending: Cardiovascular Disease

## 2024-05-03 DIAGNOSIS — I479 Paroxysmal tachycardia, unspecified: Secondary | ICD-10-CM | POA: Diagnosis not present

## 2024-05-03 DIAGNOSIS — R0602 Shortness of breath: Secondary | ICD-10-CM

## 2024-05-03 DIAGNOSIS — R600 Localized edema: Secondary | ICD-10-CM

## 2024-05-10 ENCOUNTER — Other Ambulatory Visit (HOSPITAL_COMMUNITY)

## 2024-05-20 ENCOUNTER — Ambulatory Visit (HOSPITAL_COMMUNITY)
Admission: RE | Admit: 2024-05-20 | Discharge: 2024-05-20 | Disposition: A | Discharge status: Home / Self Care | Source: Ambulatory Visit | Attending: Internal Medicine | Admitting: Internal Medicine

## 2024-05-20 VITALS — BP 118/76 | HR 74 | Ht 65.0 in | Wt 255.2 lb

## 2024-05-20 DIAGNOSIS — I4891 Unspecified atrial fibrillation: Secondary | ICD-10-CM

## 2024-05-20 DIAGNOSIS — I480 Paroxysmal atrial fibrillation: Secondary | ICD-10-CM | POA: Diagnosis not present

## 2024-05-20 NOTE — Progress Notes (Signed)
 Primary Care Physician: Maree Leni Edyth DELENA, MD Primary Cardiologist: Maude Emmer, MD Electrophysiologist: None     Referring Physician: Dr. Cindie Hoose Sarah Spencer is a 68 y.o. female with a history of GERD, obesity, HLD, and atrial fibrillation who presents for consultation in the [facility]. Seen by Dr. Emmer on 6/12 for elevated HR and LE edema. 30-day event monitor placed and LE U/S ordered. U/S was negative. Cardiac monitor alert on 6/27 for new Afib; patient placed on Eliquis . Rhythm strip showing Afib noted for event on 04/08/24 at 1747. Patient was prescribed Eliquis  5 mg BID for a CHADS2VASC score of 2.  On follow up 05/20/24, patient is currently in NSR. Cardiac monitor worn overall reassuring with one episode of Afib. She is taking Toprol  12.5 mg at bedtime.   Today, she denies symptoms of chest pain, shortness of breath, orthopnea, PND, lower extremity edema, dizziness, presyncope, syncope, snoring, daytime somnolence, bleeding, or neurologic sequela. The patient is tolerating medications without difficulties and is otherwise without complaint today.    Atrial Fibrillation Risk Factors:  she does have symptoms or diagnosis of sleep apnea.  she has a BMI of Body mass index is 42.47 kg/m.SABRA Filed Weights   05/20/24 1535  Weight: 115.8 kg     Current Outpatient Medications  Medication Sig Dispense Refill   albuterol  (VENTOLIN  HFA) 108 (90 Base) MCG/ACT inhaler Inhale 2 puffs into the lungs every 6 (six) hours as needed. 18 g 0   atorvastatin  (LIPITOR) 40 MG tablet Take 1 tablet (40 mg total) by mouth daily. 90 tablet 2   Cholecalciferol (VITAMIN D3) 50 MCG (2000 UT) TABS Take 2,000 Units by mouth daily.     clonazePAM  (KLONOPIN ) 0.5 MG tablet Take 0.25-0.5 mg by mouth 3 (three) times daily as needed for anxiety. (Patient taking differently: Take 0.25-0.5 mg by mouth as needed for anxiety.)     Coenzyme Q10 (COQ10) 100 MG CAPS Take 100 mg by  mouth daily.     diclofenac  (VOLTAREN ) 75 MG EC tablet Take 75 mg by mouth daily.     ELDERBERRY PO Take 1 tablet by mouth every morning.     famotidine  (PEPCID ) 20 MG tablet Take 20 mg by mouth 2 (two) times daily. (Patient taking differently: Take 20 mg by mouth daily.)     furosemide  (LASIX ) 20 MG tablet Take 1 tablet (20 mg total) by mouth as needed for fluid or edema. 90 tablet 3   metoprolol  succinate (TOPROL  XL) 25 MG 24 hr tablet Take 1 tablet (25 mg total) by mouth daily. (Patient taking differently: Take 12.5 mg by mouth as needed.) 30 tablet 11   montelukast  (SINGULAIR ) 10 MG tablet Take 10 mg by mouth at bedtime.     Multiple Vitamin (MULTIVITAMIN) tablet Take 1 tablet by mouth daily.     niacin 500 MG tablet Take 500 mg by mouth at bedtime.     Propylene Glycol (SYSTANE COMPLETE) 0.6 % SOLN Place 1 drop into both eyes 2 (two) times daily as needed (dry eyes).     vitamin C (ASCORBIC ACID) 250 MG tablet Take 250 mg by mouth daily.     citalopram  (CELEXA ) 20 MG tablet Take 20 mg by mouth daily. (Patient not taking: Reported on 05/20/2024)     No current facility-administered medications for this encounter.    Atrial Fibrillation Management history:  Previous antiarrhythmic drugs: none Previous cardioversions: none Previous ablations: none Anticoagulation history: Eliquis   ROS- All systems are reviewed and negative except as per the HPI above.  Physical Exam: BP 118/76   Pulse 74   Ht 5' 5 (1.651 m)   Wt 115.8 kg   BMI 42.47 kg/m   GEN- The patient is well appearing, alert and oriented x 3 today.   Neck - no JVD or carotid bruit noted Lungs- Clear to ausculation bilaterally, normal work of breathing Heart- Regular rate and rhythm, no murmurs, rubs or gallops, PMI not laterally displaced Extremities- no clubbing, cyanosis, or edema Skin - no rash or ecchymosis noted   EKG today demonstrates  Vent. rate 74 BPM PR interval 164 ms QRS duration 80 ms QT/QTcB 370/410  ms P-R-T axes 58 -41 22 Normal sinus rhythm Left axis deviation Inferior infarct (cited on or before 25-Mar-2024) Anterior infarct , age undetermined Abnormal ECG When compared with ECG of 12-Apr-2024 15:44, Premature atrial complexes are no longer Present   Echo 04/14/24:  1. Left ventricular ejection fraction, by estimation, is 60 to 65%. The  left ventricle has normal function. The left ventricle has no regional  wall motion abnormalities. Left ventricular diastolic parameters were  normal. The average left ventricular  global longitudinal strain is -14.4 %. The global longitudinal strain is  abnormal.   2. Right ventricular systolic function is normal. The right ventricular  size is normal.   3. The mitral valve is grossly normal. No evidence of mitral valve  regurgitation. No evidence of mitral stenosis.   4. The aortic valve is normal in structure. Aortic valve regurgitation is  not visualized. No aortic stenosis is present.   5. The inferior vena cava is normal in size with greater than 50%  respiratory variability, suggesting right atrial pressure of 3 mmHg.   Cardiac monitor 04/2024: NSR Average HR 84 bpm with range 50-156 bpm PVC;s < 1% PACs < 5%  Short run PSVT 12 beats rate 147 bpm One strip read as PAF rate 154 at 04/08/24 17:47 EDT     Maude Emmer MD [facility]  ASSESSMENT & PLAN CHA2DS2-VASc Score = 2  The patient's score is based upon: CHF History: 0 HTN History: 0 Diabetes History: 0 Stroke History: 0 Vascular Disease History: 0 Age Score: 1 Gender Score: 1       ASSESSMENT AND PLAN: Paroxysmal Atrial Fibrillation (ICD10:  I48.0) The patient's CHA2DS2-VASc score is 2, indicating a 2.2% annual risk of stroke.    Patient is currently in NSR. Continue Toprol  12.5 mg at bedtime. Does not require Eliquis  at this time due to low risk score.     Follow up 6 months Afib clinic.    Terra Pac, PA-C  Af[facility] Clinic [facility] [address] [place], [place] [address] [phone]

## 2024-08-06 ENCOUNTER — Other Ambulatory Visit: Payer: Self-pay | Admitting: Family Medicine

## 2024-08-06 DIAGNOSIS — Z1231 Encounter for screening mammogram for malignant neoplasm of breast: Secondary | ICD-10-CM

## 2024-08-20 ENCOUNTER — Ambulatory Visit
Admission: RE | Admit: 2024-08-20 | Discharge: 2024-08-20 | Disposition: A | Discharge status: Home / Self Care | Source: Ambulatory Visit | Attending: Family Medicine | Admitting: Family Medicine

## 2024-08-20 DIAGNOSIS — Z1231 Encounter for screening mammogram for malignant neoplasm of breast: Secondary | ICD-10-CM

## 2024-08-21 ENCOUNTER — Encounter (HOSPITAL_COMMUNITY): Payer: Self-pay | Admitting: *Deleted

## 2024-08-21 ENCOUNTER — Ambulatory Visit (HOSPITAL_COMMUNITY)
Admission: EM | Admit: 2024-08-21 | Discharge: 2024-08-21 | Disposition: A | Discharge status: Home / Self Care | Attending: Emergency Medicine | Admitting: Emergency Medicine

## 2024-08-21 DIAGNOSIS — M546 Pain in thoracic spine: Secondary | ICD-10-CM

## 2024-08-21 MED ORDER — KETOROLAC TROMETHAMINE 30 MG/ML IJ SOLN
INTRAMUSCULAR | Status: AC
  Filled 2024-08-21: qty 1

## 2024-08-21 MED ORDER — GABAPENTIN 300 MG PO CAPS: 300.0000 mg | ORAL_CAPSULE | Freq: Three times a day (TID) | ORAL | 0 refills | Status: AC

## 2024-08-21 MED ORDER — KETOROLAC TROMETHAMINE 30 MG/ML IJ SOLN
30.0000 mg | Freq: Once | INTRAMUSCULAR | Status: AC
Start: 2024-08-21 — End: 2024-08-21
  Administered 2024-08-21: 30 mg via INTRAMUSCULAR

## 2024-08-21 MED ORDER — LIDOCAINE VISCOUS HCL 2 % MT SOLN
OROMUCOSAL | Status: AC
  Filled 2024-08-21: qty 15

## 2024-08-21 MED ORDER — LIDOCAINE VISCOUS HCL 2 % MT SOLN
15.0000 mL | Freq: Once | OROMUCOSAL | Status: AC
  Administered 2024-08-21: 15 mL via TOPICAL

## 2024-08-21 NOTE — ED Triage Notes (Signed)
 Pt states she has mid back spasms X 2 days. The pain comes about every 20 seconds. She has been taking tylenol  and baclofen which are not helping at all.

## 2024-08-21 NOTE — ED Provider Notes (Signed)
 [facility]    CSN: [id number] Arrival date & time: 08/21/24  1414      History   Chief Complaint Chief Complaint  Patient presents with   Back Pain    HPI Sarah Spencer is a 68 y.o. female. Pt with R side middle back pain since yesterday. It comes in spasms, a kind of grabbing pain, about every 20 seconds. Has tried baclofen and tylenol  for no relief. Denies electric-like pain, numbness, weakness   Back Pain   Past Medical History:  Diagnosis Date   Allergies    Anxiety    Follows w/ PCP @ [address] Dr[address] Maree.   Arthritis    knees   Bronchitis    recurrent   COVID-19 11/06/2020   Depression    Follows w/ PCP @ [facility], Dr. Maree   Family history of adverse reaction to anesthesia    sister-extreme N&V   GERD (gastroesophageal reflux disease)    Hyperlipidemia    Follows w/ PCP.   Obesity    Osteoporosis    Pedal edema    mild   Pneumonia    as a child and a few times as an adult, hasn't had in years as of 02/11/23   PONV (postoperative nausea and vomiting)    SOBOE (shortness of breath on exertion) 2022   Only when patient had bronchitis. Seen by Dr. Paraschos@ [facility]. 08/08/21 TEE was normal EF>55%, mild TR.   Vitamin D deficiency     Patient Active Problem List   Diagnosis Date Noted   Postmenopausal bleeding 03/03/2023   S/P lumbar laminectomy 09/21/2021    Past Surgical History:  Procedure Laterality Date   CERVICAL DISC SURGERY     Dr. Joshua , neurosurgeon.   CESAREAN SECTION  1994   x 1   COLONOSCOPY  2023   ESOPHAGOGASTRODUODENOSCOPY     many years ago as of 02/11/23   LAMINECTOMY WITH POSTERIOR LATERAL ARTHRODESIS LEVEL 3 Bilateral 09/21/2021   Procedure: Laminectomy and Foraminotomy Lumbar Two - Lumbar Three, Lumbar Three - Lumbar Four, Lumbar Four - Lumbar Five - Bilateral, Posterolateral Fusion Lumbar Two - Lumbar Three, Lumbar Three - Lumbar Four, Lumbar Four - Lumbar Five;  Surgeon: Joshua Alm RAMAN, MD;   Location: [facility] OR;  Service: Neurosurgery;  Laterality: Bilateral;   laparascopy     gyn, 1980's   LAPAROSCOPIC VAGINAL HYSTERECTOMY WITH SALPINGO OOPHORECTOMY Bilateral 03/03/2023   Procedure: LAPAROSCOPIC ASSISTED VAGINAL HYSTERECTOMY WITH SALPINGO OOPHORECTOMY;  Surgeon: Johnnye Ade, MD;  Location: [facility];  Service: Gynecology;  Laterality: Bilateral;   TONSILLECTOMY  1964   TOTAL SHOULDER REPLACEMENT Left     OB History   No obstetric history on file.      Home Medications    Prior to Admission medications   Medication Sig Start Date End Date Taking? Authorizing Provider  atorvastatin  (LIPITOR) 40 MG tablet Take 1 tablet (40 mg total) by mouth daily. 01/23/24  Yes Vannie Reche RAMAN, NP  baclofen (LIORESAL) 10 MG tablet Take 10 mg by mouth every 8 (eight) hours as needed.   Yes [provider]  Cholecalciferol (VITAMIN D3) 50 MCG (2000 UT) TABS Take 2,000 Units by mouth daily.   Yes [provider]  Coenzyme Q10 (COQ10) 100 MG CAPS Take 100 mg by mouth daily.   Yes [provider]  diclofenac  (VOLTAREN ) 75 MG EC tablet Take 75 mg by mouth daily. 07/06/20  Yes [provider]  STEVIE  PO Take 1 tablet by mouth every morning.   Yes [provider]  gabapentin  (NEURONTIN ) 300 MG capsule Take 1 capsule (300 mg total) by mouth 3 (three) times daily. Start with 300mg  every day, if that is not enough to control pain, can increase to 300mg  BID, if that is not enough to control pain, max dose 300mg  TID 08/21/24  Yes Richad Jon HERO, NP  montelukast  (SINGULAIR ) 10 MG tablet Take 10 mg by mouth at bedtime. 08/15/20  Yes [provider]  Multiple Vitamin (MULTIVITAMIN) tablet Take 1 tablet by mouth daily.   Yes [provider]  niacin 500 MG tablet Take 500 mg by mouth at bedtime.   Yes [provider]  vitamin C (ASCORBIC ACID) 250 MG tablet Take 250 mg by mouth daily.   Yes [provider]   albuterol  (VENTOLIN  HFA) 108 (90 Base) MCG/ACT inhaler Inhale 2 puffs into the lungs every 6 (six) hours as needed. 02/11/21   Tonette Lauraine HERO, PA-C  citalopram  (CELEXA ) 20 MG tablet Take 20 mg by mouth daily. Patient not taking: No sig reported    [provider]  clonazePAM  (KLONOPIN ) 0.5 MG tablet Take 0.25-0.5 mg by mouth 3 (three) times daily as needed for anxiety. Patient taking differently: Take 0.25-0.5 mg by mouth as needed for anxiety. 07/26/20   [provider]  famotidine  (PEPCID ) 20 MG tablet Take 20 mg by mouth 2 (two) times daily. Patient taking differently: Take 20 mg by mouth daily.    [provider]  furosemide  (LASIX ) 20 MG tablet Take 1 tablet (20 mg total) by mouth as needed for fluid or edema. 04/19/24   Parthenia Olivia HERO, PA-C  metoprolol  succinate (TOPROL  XL) 25 MG 24 hr tablet Take 1 tablet (25 mg total) by mouth daily. Patient taking differently: Take 12.5 mg by mouth as needed. 04/12/24 04/12/25  Terra Fairy PARAS, PA-C  Propylene Glycol (SYSTANE COMPLETE) 0.6 % SOLN Place 1 drop into both eyes 2 (two) times daily as needed (dry eyes).    [provider]    Family History Family History  Problem Relation Age of Onset   Heart disease Father    Heart disease Sister    Heart disease Sister    Stroke Sister        caused by medicine   Other Brother        step brother   Colon cancer Neg Hx    Esophageal cancer Neg Hx    Rectal cancer Neg Hx    Breast cancer Neg Hx     Social History Social History   Tobacco Use   Smoking status: Never   Smokeless tobacco: Never  Vaping Use   Vaping status: Never Used  Substance Use Topics   Alcohol use: No   Drug use: No     Allergies   Cefuroxime axetil, Bupropion, and Cefdinir   Review of Systems Review of Systems  Musculoskeletal:  Positive for back pain.     Physical Exam Triage Vital Signs ED Triage Vitals  Encounter Vitals Group     BP 08/21/24 1549 (!) 162/90      Girls Systolic BP Percentile --      Girls Diastolic BP Percentile --      Boys Systolic BP Percentile --      Boys Diastolic BP Percentile --      Pulse Rate 08/21/24 1549 85     Resp 08/21/24 1549 18     Temp 08/21/24  1549 98 F (36.7 C)     Temp Source 08/21/24 1549 Oral     SpO2 08/21/24 1549 95 %     Weight --      Height --      Head Circumference --      Peak Flow --      Pain Score 08/21/24 1547 10     Pain Loc --      Pain Education --      Exclude from Growth Chart --    No data found.  Updated Vital Signs BP (!) 162/90 (BP Location: Left Arm)   Pulse 85   Temp 98 F (36.7 C) (Oral)   Resp 18   SpO2 95%   Visual Acuity Right Eye Distance:   Left Eye Distance:   Bilateral Distance:    Right Eye Near:   Left Eye Near:    Bilateral Near:     Physical Exam Constitutional:      Appearance: Normal appearance. She is not ill-appearing.     Comments: About twice a minute, pt grimaces from pain  Musculoskeletal:     Thoracic back: Tenderness present. No edema or bony tenderness.       Back:     Comments: Area of pain and skin to the R of it are mildly erythematous.   I palpated area of pain during several paroxysms of pain and cannot appreciate any muscle spasms  Neurological:     Mental Status: She is alert.      UC Treatments / Results  Labs (all labs ordered are listed, but only abnormal results are displayed) Labs Reviewed - No data to display  EKG   Radiology No results found.  Procedures Procedures (including critical care time)  Medications Ordered in UC Medications  ketorolac  (TORADOL ) 30 MG/ML injection 30 mg (30 mg Intramuscular Given 08/21/24 1614)  lidocaine  (XYLOCAINE ) 2 % viscous mouth solution 15 mL (15 mLs Topical Given 08/21/24 1614)    Initial Impression / Assessment and Plan / UC Course  I have reviewed the triage vital signs and the nursing notes.  Pertinent labs & imaging results that were available during my care of  the patient were reviewed by me and considered in my medical decision making (see chart for details).    She reports she's had the shingles vaccine and never had shingles.   She experienced zero relief with topical lidocaine  and toradol . Her pain is localized to a small area of her back. It's a very unusual pain and I wonder if it is a nerve pain or if this could be early shingles. She has no rash on her skin but the general area is a little pink - it is located under the band of her bra so it could be pink from rubbing by bra. I discussed options with her - I think she should try gabapentin  for possible nerve pain, if it does not improve her sx, she has oxycodone  at home from a prior shoulder surgery of which she has not taken any that she can try. She drove herself to UC today and I cannot try any opioids here.   Final Clinical Impressions(s) / UC Diagnoses   Final diagnoses:  Acute right-sided thoracic back pain     Discharge Instructions      Start with one gabapentin  for pain. If it doesn't help at all, try your oxycodone  at home per bottle instructions.   If this is not getting better, please see your  primary care provider.      ED Prescriptions     Medication Sig Dispense Auth. Provider   gabapentin  (NEURONTIN ) 300 MG capsule Take 1 capsule (300 mg total) by mouth 3 (three) times daily. Start with 300mg  every day, if that is not enough to control pain, can increase to 300mg  BID, if that is not enough to control pain, max dose 300mg  TID 30 capsule Iona Stay, Jon HERO, NP      PDMP not reviewed this encounter.   Richad Jon HERO, NP 08/21/24 [phone]

## 2024-08-21 NOTE — Discharge Instructions (Addendum)
 Start with one gabapentin  for pain. If it doesn't help at all, try your oxycodone  at home per bottle instructions.   If this is not getting better, please see your primary care provider.

## 2024-08-25 ENCOUNTER — Ambulatory Visit
Admission: RE | Admit: 2024-08-25 | Discharge: 2024-08-25 | Disposition: A | Discharge status: Home / Self Care | Source: Ambulatory Visit | Attending: Adult Health Nurse Practitioner | Admitting: Adult Health Nurse Practitioner

## 2024-08-25 ENCOUNTER — Other Ambulatory Visit: Payer: Self-pay | Admitting: Adult Health Nurse Practitioner

## 2024-08-25 DIAGNOSIS — M549 Dorsalgia, unspecified: Secondary | ICD-10-CM

## 2024-11-23 ENCOUNTER — Ambulatory Visit (HOSPITAL_COMMUNITY): Admitting: Internal Medicine
